# Patient Record
Sex: Female | Born: 1970 | Race: White | Hispanic: No | Marital: Married | Smoking: Former smoker
Health system: Southern US, Community
[De-identification: ages and names within clinical notes are randomized; demographics above are authoritative.]

## PROBLEM LIST (undated history)

## (undated) DIAGNOSIS — F419 Anxiety disorder, unspecified: Secondary | ICD-10-CM

## (undated) DIAGNOSIS — M1990 Unspecified osteoarthritis, unspecified site: Secondary | ICD-10-CM

## (undated) DIAGNOSIS — F909 Attention-deficit hyperactivity disorder, unspecified type: Secondary | ICD-10-CM

## (undated) HISTORY — PX: OTHER SURGICAL HISTORY: SHX169

## (undated) HISTORY — DX: Unspecified osteoarthritis, unspecified site: M19.90

## (undated) HISTORY — PX: BREAST BIOPSY: SHX20

## (undated) HISTORY — PX: TUBAL LIGATION: SHX77

## (undated) HISTORY — PX: CHOLECYSTECTOMY: SHX55

---

## 2006-05-17 HISTORY — PX: OTHER SURGICAL HISTORY: SHX169

## 2006-05-17 HISTORY — PX: BREAST BIOPSY: SHX20

## 2006-05-17 HISTORY — PX: BREAST EXCISIONAL BIOPSY: SUR124

## 2012-05-08 ENCOUNTER — Encounter: Payer: Self-pay | Admitting: Physician Assistant

## 2012-05-08 ENCOUNTER — Ambulatory Visit (INDEPENDENT_AMBULATORY_CARE_PROVIDER_SITE_OTHER): Payer: Managed Care, Other (non HMO) | Admitting: Physician Assistant

## 2012-05-08 VITALS — BP 110/60 | HR 67 | Ht 64.0 in | Wt 113.0 lb

## 2012-05-08 DIAGNOSIS — Z79899 Other long term (current) drug therapy: Secondary | ICD-10-CM

## 2012-05-08 DIAGNOSIS — M79609 Pain in unspecified limb: Secondary | ICD-10-CM

## 2012-05-08 DIAGNOSIS — Z7189 Other specified counseling: Secondary | ICD-10-CM

## 2012-05-08 DIAGNOSIS — Z716 Tobacco abuse counseling: Secondary | ICD-10-CM

## 2012-05-08 DIAGNOSIS — M79641 Pain in right hand: Secondary | ICD-10-CM

## 2012-05-08 DIAGNOSIS — M2550 Pain in unspecified joint: Secondary | ICD-10-CM

## 2012-05-08 MED ORDER — BUPROPION HCL ER (SR) 150 MG PO TB12: ORAL_TABLET | ORAL | Status: DC

## 2012-05-08 MED ORDER — MELOXICAM 7.5 MG PO TABS: 7.5000 mg | ORAL_TABLET | Freq: Every day | ORAL | Status: DC

## 2012-05-08 NOTE — Patient Instructions (Addendum)
Glucosamine Chondrotin, Fish Oil.   Start mobic daily for 2 weeks then as needed.   Wellbutrin to stop smoking.   Osteoarthritis Osteoarthritis is the most common form of arthritis. It is redness, soreness, and swelling (inflammation) affecting the cartilage. Cartilage acts as a cushion, covering the ends of bones where they meet to form a joint. CAUSES  Over time, the cartilage begins to wear away. This causes bone to rub on bone. This produces pain and stiffness in the affected joints. Factors that contribute to this problem are:  Excessive body weight.  Age.  Overuse of joints. SYMPTOMS   People with osteoarthritis usually experience joint pain, swelling, or stiffness.  Over time, the joint may lose its normal shape.  Small deposits of bone (osteophytes) may grow on the edges of the joint.  Bits of bone or cartilage can break off and float inside the joint space. This may cause more pain and damage.  Osteoarthritis can lead to depression, anxiety, feelings of helplessness, and limitations on daily activities. The most commonly affected joints are in the:  Ends of the fingers.  Thumbs.  Neck.  Lower back.  Knees.  Hips. DIAGNOSIS  Diagnosis is mostly based on your symptoms and exam. Tests may be helpful, including:  X-rays of the affected joint.  A computerized magnetic scan (MRI).  Blood tests to rule out other types of arthritis.  Joint fluid tests. This involves using a needle to draw fluid from the joint and examining the fluid under a microscope. TREATMENT  Goals of treatment are to control pain, improve joint function, maintain a normal body weight, and maintain a healthy lifestyle. Treatment approaches may include:  A prescribed exercise program with rest and joint relief.  Weight control with nutritional education.  Pain relief techniques such as:  Properly applied heat and cold.  Electric pulses delivered to nerve endings under the skin  (transcutaneous electrical nerve stimulation, TENS).  Massage.  Certain supplements. Ask your caregiver before using any supplements, especially in combination with prescribed drugs.  Medicines to control pain, such as:  Acetaminophen.  Nonsteroidal anti-inflammatory drugs (NSAIDs), such as naproxen.  Narcotic or central-acting agents, such as tramadol. This drug carries a risk of addiction and is generally prescribed for short-term use.  Corticosteroids. These can be given orally or as injection. This is a short-term treatment, not recommended for routine use.  Surgery to reposition the bones and relieve pain (osteotomy) or to remove loose pieces of bone and cartilage. Joint replacement may be needed in advanced states of osteoarthritis. HOME CARE INSTRUCTIONS  Your caregiver can recommend specific types of exercise. These may include:  Strengthening exercises. These are done to strengthen the muscles that support joints affected by arthritis. They can be performed with weights or with exercise bands to add resistance.  Aerobic activities. These are exercises, such as brisk walking or low-impact aerobics, that get your heart pumping. They can help keep your lungs and circulatory system in shape.  Range-of-motion activities. These keep your joints limber.  Balance and agility exercises. These help you maintain daily living skills. Learning about your condition and being actively involved in your care will help improve the course of your osteoarthritis. SEEK MEDICAL CARE IF:   You feel hot or your skin turns red.  You develop a rash in addition to your joint pain.  You have an oral temperature above 102 F (38.9 C). FOR MORE INFORMATION  National Institute of Arthritis and Musculoskeletal and Skin Diseases: www.niams.http://www.myers.net/ National Institute  on Aging: https://walker.com/ [employer]: www.rheumatology.org Document Released: 05/03/2005 Document Revised:  07/26/2011 Document Reviewed: 08/14/2009 [facility] Patient Information 2013 [place], [place].   Smoking Cessation Quitting smoking is important to your health and has many advantages. However, it is not always easy to quit since nicotine is a very addictive drug. Often times, people try 3 times or more before being able to quit. This document explains the best ways for you to prepare to quit smoking. Quitting takes hard work and a lot of effort, but you can do it. ADVANTAGES OF QUITTING SMOKING  You will live longer, feel better, and live better.  Your body will feel the impact of quitting smoking almost immediately.  Within 20 minutes, blood pressure decreases. Your pulse returns to its normal level.  After 8 hours, carbon monoxide levels in the blood return to normal. Your oxygen level increases.  After 24 hours, the chance of having a heart attack starts to decrease. Your breath, hair, and body stop smelling like smoke.  After 48 hours, damaged nerve endings begin to recover. Your sense of taste and smell improve.  After 72 hours, the body is virtually free of nicotine. Your bronchial tubes relax and breathing becomes easier.  After 2 to 12 weeks, lungs can hold more air. Exercise becomes easier and circulation improves.  The risk of having a heart attack, stroke, cancer, or lung disease is greatly reduced.  After 1 year, the risk of coronary heart disease is cut in half.  After 5 years, the risk of stroke falls to the same as a nonsmoker.  After 10 years, the risk of lung cancer is cut in half and the risk of other cancers decreases significantly.  After 15 years, the risk of coronary heart disease drops, usually to the level of a nonsmoker.  If you are pregnant, quitting smoking will improve your chances of having a healthy baby.  The people you live with, especially any children, will be healthier.  You will have extra money to spend on things other than  cigarettes. QUESTIONS TO THINK ABOUT BEFORE ATTEMPTING TO QUIT You may want to talk about your answers with your caregiver.  Why do you want to quit?  If you tried to quit in the past, what helped and what did not?  What will be the most difficult situations for you after you quit? How will you plan to handle them?  Who can help you through the tough times? Your family? Friends? A caregiver?  What pleasures do you get from smoking? What ways can you still get pleasure if you quit? Here are some questions to ask your caregiver:  How can you help me to be successful at quitting?  What medicine do you think would be best for me and how should I take it?  What should I do if I need more help?  What is smoking withdrawal like? How can I get information on withdrawal? GET READY  Set a quit date.  Change your environment by getting rid of all cigarettes, ashtrays, matches, and lighters in your home, car, or work. Do not let people smoke in your home.  Review your past attempts to quit. Think about what worked and what did not. GET SUPPORT AND ENCOURAGEMENT You have a better chance of being successful if you have help. You can get support in many ways.  Tell your family, friends, and co-workers that you are going to quit and need their support. Ask them not to smoke  around you.  Get individual, group, or telephone counseling and support. Programs are available at [employer] and health centers. Call your local health department for information about programs in your area.  Spiritual beliefs and practices may help some smokers quit.  Download a "quit meter" on your computer to keep track of quit statistics, such as how long you have gone without smoking, cigarettes not smoked, and money saved.  Get a self-help book about quitting smoking and staying off of tobacco. LEARN NEW SKILLS AND BEHAVIORS  Distract yourself from urges to smoke. Talk to someone, go for a walk, or occupy  your time with a task.  Change your normal routine. Take a different route to work. Drink tea instead of coffee. Eat breakfast in a different place.  Reduce your stress. Take a hot bath, exercise, or read a book.  Plan something enjoyable to do every day. Reward yourself for not smoking.  Explore interactive web-based programs that specialize in helping you quit. GET MEDICINE AND USE IT CORRECTLY Medicines can help you stop smoking and decrease the urge to smoke. Combining medicine with the above behavioral methods and support can greatly increase your chances of successfully quitting smoking.  Nicotine replacement therapy helps deliver nicotine to your body without the negative effects and risks of smoking. Nicotine replacement therapy includes nicotine gum, lozenges, inhalers, nasal sprays, and skin patches. Some may be available over-the-counter and others require a prescription.  Antidepressant medicine helps people abstain from smoking, but how this works is unknown. This medicine is available by prescription.  Nicotinic receptor partial agonist medicine simulates the effect of nicotine in your brain. This medicine is available by prescription. Ask your caregiver for advice about which medicines to use and how to use them based on your health history. Your caregiver will tell you what side effects to look out for if you choose to be on a medicine or therapy. Carefully read the information on the package. Do not use any other product containing nicotine while using a nicotine replacement product.  RELAPSE OR DIFFICULT SITUATIONS Most relapses occur within the first 3 months after quitting. Do not be discouraged if you start smoking again. Remember, most people try several times before finally quitting. You may have symptoms of withdrawal because your body is used to nicotine. You may crave cigarettes, be irritable, feel very hungry, cough often, get headaches, or have difficulty concentrating.  The withdrawal symptoms are only temporary. They are strongest when you first quit, but they will go away within 10 14 days. To reduce the chances of relapse, try to:  Avoid drinking alcohol. Drinking lowers your chances of successfully quitting.  Reduce the amount of caffeine you consume. Once you quit smoking, the amount of caffeine in your body increases and can give you symptoms, such as a rapid heartbeat, sweating, and anxiety.  Avoid smokers because they can make you want to smoke.  Do not let weight gain distract you. Many smokers will gain weight when they quit, usually less than 10 pounds. Eat a healthy diet and stay active. You can always lose the weight gained after you quit.  Find ways to improve your mood other than smoking. FOR MORE INFORMATION  www.smokefree.gov  Document Released: 04/27/2001 Document Revised: 11/02/2011 Document Reviewed: 08/12/2011 [facility] Patient Information 2013 [place], [place].

## 2012-05-08 NOTE — Progress Notes (Signed)
Subjective:    Patient ID: Debra Massey, female    DOB: 09/08/1970, 41 y.o.   MRN: [id number]  HPI Patient is a 41 year old female who presents to the clinic to establish care and to discuss ongoing right hand finger stiffness and pain. Past medical history is negative for any chronic or ongoing condition. Patient does take Aleve as needed for hand pain as well as other aches and pains.  Patient has had ongoing bilateral hand pain and stiffness for the last 3 years that has been manageable with a week. She has noticed in the last 3 weeks that the pain has gotten worse mostly in the right hand. She describes the pain as burning and shooting throughout her hand. Aleve does not touch this pain. 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 The pain gets worse throughout the day and with movement. About 4 years ago she did have nerve conduction studies done on both hands and were normal. She denies any numbness or tingling of the fingers or hand. She does feel like sometimes the index finger almost gets stuck. She denies any joint swelling.  Patient reports that her Pap and regular blood work was done last year about this time. She reports everything was normal. 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 Patient has yearly mammograms do to the family history of mother having breast cancer.  Surgical history positive for cholecystectomy and laparoscopically.  Patient is a current smoker of a pack a day for the last 27 years. She is interested in quitting. She did try Wellbutrin at one point but insurance would not pay for it for smoking cessation. She was on it for a short while and it did help control her urges.     Review of Systems     Objective:   Physical Exam  Constitutional: She is oriented to person, place, and time. She appears well-developed and well-nourished.  HENT:  Head: Normocephalic and atraumatic.  Cardiovascular: Normal rate, regular rhythm and normal  heart sounds.   Pulmonary/Chest: Effort normal and breath sounds normal. She has no wheezes.  Musculoskeletal:       Bilateral range of motion of both hands is full and without pain. Strength of both hands is 5 out of 5. There is no bony tenderness with palpation with either hand. No joint swelling noted bilaterally. They did appear to be enlarged MCP joints. Patient's fingers were able to be extended and flexed without clicking or cracking.  Neurological: She is alert and oriented to person, place, and time.  Skin: Skin is warm and dry.  Psychiatric: She has a normal mood and affect. Her behavior is normal.          Assessment & Plan:  Right hand and joint pain-I suspect patient is suffering from osteoarthritis do to long history of overuse in her field of work as a Interior and spatial designer. I discussed with patient other possibilities such as RA. I do not think at this time we need to do imaging or blood testing for RA. I would like to try Mobic 7.5 mg daily and see how she responds. Encouraged patient to take daily for 2 weeks and then use only as needed. Discussed with patient not to take any other anti-inflammatories while on Mobic. Will check kidney today to make sure there is no abnormality since putting on an anti-inflammatory. Gave patient handout on osteoarthritis and other symptomatic treatment she could consider. Mentioned over-the-counter treatment such as  glucosamine chondrotic and Fish oil. Patient was instructed to follow up in the clinic in 4-6 weeks to assess status. She is always welcome to call if pain worsens or new symptoms arise.  Smoking cessation-patient reports she is ready to quit smoking and we would like to try Wellbutrin again. I did give her a three-month supply. Instructions were to start 150 mg once a day for 3 days then increase to twice a day for 6-12 weeks. Instructed patient that the goal is to stop smoking within the first 7 days of treatment with Wellbutrin. We talked a lot  about the oral fixation of smoking and ways to combat that with healthy options. Gave patient a handout on smoking cessation and the encouraging timeline of what happens with the body after stopping smoking. Patient does have T6 and encourage usage of those that of cigarettes. Did report patient could use Nicorette along with Wellbutrin.Would like to see in 3 months to talk about progress.  30 minutes was spent with patient and greater than 50% of the visit was spent in counseling about smoking cessation.

## 2012-05-09 LAB — COMPREHENSIVE METABOLIC PANEL
ALT: 18 U/L (ref 0–35)
Alkaline Phosphatase: 37 U/L — ABNORMAL LOW (ref 39–117)
Creat: 0.77 mg/dL (ref 0.50–1.10)
Sodium: 137 mEq/L (ref 135–145)
Total Bilirubin: 0.5 mg/dL (ref 0.3–1.2)
Total Protein: 7.1 g/dL (ref 6.0–8.3)

## 2012-06-16 ENCOUNTER — Telehealth: Payer: Self-pay | Admitting: *Deleted

## 2012-06-16 NOTE — Telephone Encounter (Signed)
Pt can't tell yet if the wellbutrin is helping with the craving because she had been off of it for a few weeks & just started back this past Monday.  She doesn't really want to go the chantix route so she is going to try to eat in the mornings & stay on the wellbutrin.

## 2012-06-16 NOTE — Telephone Encounter (Signed)
Chantix twice a day medication also. I do think it helps more if quitting smoking if that is your goal. It does come with risk of side effect of weird thoughts and suicidal thinking. If have these can stop. Most meds are requested you eat with so may run into that problem. Can try taking without eating or eat a couple of bites of something. Is wellbutrin helping curb you craving?

## 2012-06-16 NOTE — Telephone Encounter (Signed)
Pt calls & states that she's having a hard time taking the wellbutrin twice a day because she doesn't eat in the mornings.  She is asking if chantix would work just as well as wellbutrin & wants to know if it's taken just once a day or if taking the wellbutrin just once a day will be effective enough.  Please advise

## 2012-09-22 ENCOUNTER — Other Ambulatory Visit: Payer: Self-pay | Admitting: *Deleted

## 2012-09-22 MED ORDER — MELOXICAM 7.5 MG PO TABS: 7.5000 mg | ORAL_TABLET | Freq: Every day | ORAL | Status: DC

## 2012-09-22 NOTE — Telephone Encounter (Signed)
meloxicam refilled.

## 2012-09-26 ENCOUNTER — Telehealth: Payer: Self-pay | Admitting: *Deleted

## 2012-09-26 NOTE — Telephone Encounter (Signed)
Pt calls & states that her thumb on her right hand is killing her.  She recently started taking the mobic 7.5mg  again but feels like it isn't helping.  She asked if she could double up on the meds.  I advised against it & told her I would speak with you first.  She is a pt of [facility]. She is using [facility] [address].  Please advise

## 2012-09-26 NOTE — Telephone Encounter (Signed)
Pt notified.  She is actually on your schedule for Monday due to [facility] being [place] the [place] & her being in so much pain.

## 2012-09-26 NOTE — Telephone Encounter (Signed)
Double Mobic to 15 mg daily. She should also follow up with Tandy Gaw, PA-C regarding increased pain in a week to determine if a referral to me may be necessary.

## 2012-10-02 ENCOUNTER — Ambulatory Visit (INDEPENDENT_AMBULATORY_CARE_PROVIDER_SITE_OTHER): Payer: Managed Care, Other (non HMO)

## 2012-10-02 ENCOUNTER — Ambulatory Visit (INDEPENDENT_AMBULATORY_CARE_PROVIDER_SITE_OTHER): Payer: Managed Care, Other (non HMO) | Admitting: Sports Medicine

## 2012-10-02 ENCOUNTER — Encounter: Payer: Self-pay | Admitting: Sports Medicine

## 2012-10-02 VITALS — BP 137/85 | HR 58 | Wt 121.0 lb

## 2012-10-02 DIAGNOSIS — M25539 Pain in unspecified wrist: Secondary | ICD-10-CM

## 2012-10-02 DIAGNOSIS — M79641 Pain in right hand: Secondary | ICD-10-CM

## 2012-10-02 DIAGNOSIS — M25549 Pain in joints of unspecified hand: Secondary | ICD-10-CM

## 2012-10-02 DIAGNOSIS — M19041 Primary osteoarthritis, right hand: Secondary | ICD-10-CM | POA: Insufficient documentation

## 2012-10-02 DIAGNOSIS — M79609 Pain in unspecified limb: Secondary | ICD-10-CM

## 2012-10-02 DIAGNOSIS — M19042 Primary osteoarthritis, left hand: Secondary | ICD-10-CM | POA: Insufficient documentation

## 2012-10-02 MED ORDER — CELECOXIB 200 MG PO CAPS: ORAL_CAPSULE | ORAL | Status: DC

## 2012-10-02 NOTE — Progress Notes (Signed)
Subjective:    I'm seeing this patient as a consultation for:  Tandy Gaw, PA-C  CC: Hand pain  HPI: This is a very pleasant 42 year old female who comes in with a several month history of pain on and off but she localizes in her right second metacarpophalangeal joint. She does hair for a living, and notes it's difficult on days when she is very busy. She describes the pain as sharp, and notes swelling in the joint. She has no pain coming from her neck, and no numbness or tingling in the hand. She has been tested for carpal tunnel syndrome in the past and was negative per patient. She's been using Mobic 15 mg daily and pain has remained the same. There is no family history of rheumatologic disease. Pain is localized, moderate, stable.  Past medical history, Surgical history, Family history not pertinant except as noted below, Social history, Allergies, and medications have been entered into the medical record, reviewed, and no changes needed.   Review of Systems: No headache, visual changes, nausea, vomiting, diarrhea, constipation, dizziness, abdominal pain, skin rash, fevers, chills, night sweats, weight loss, swollen lymph nodes, body aches, joint swelling, muscle aches, chest pain, shortness of breath, mood changes, visual or auditory hallucinations.   Objective:   General: Well Developed, well nourished, and in no acute distress.  Neuro/Psych: Alert and oriented x3, extra-ocular muscles intact, able to move all 4 extremities, sensation grossly intact. Skin: Warm and dry, no rashes noted.  Respiratory: Not using accessory muscles, speaking in full sentences, trachea midline.  Cardiovascular: Pulses palpable, no extremity edema. Abdomen: Does not appear distended. Neck: Inspection unremarkable. No palpable stepoffs. Negative Spurling's maneuver. Full neck range of motion Grip strength and sensation normal in bilateral hands Strength good C4 to T1 distribution No sensory change to  C4 to T1 Negative Hoffman sign bilaterally Reflexes normal Right Wrist: Inspection normal with no visible erythema or swelling. ROM smooth and normal with good flexion and extension and ulnar/radial deviation that is symmetrical with opposite wrist. Palpation is normal over metacarpals, navicular, lunate, and TFCC; tendons without tenderness/ swelling No snuffbox tenderness. No tenderness over Canal of Guyon. Strength 5/5 in all directions without pain. Negative Finkelstein, tinel's and phalens. Negative Watson's test. Hands: Right hand shows fusiform swelling over the right second metacarpophalangeal joint compared to the left. There is tenderness to palpation over the lateral joint line. Range of motion, strength, collateral ligaments are all unremarkable. She is neurovascularly intact distally.  X-rays of the hands and wrist are reviewed, there is mild degenerative changes on the second metacarpophalangeal joint, there is also mild DJD with some subchondral sclerosis in the radiocarpal joint.  Procedure: Real-time Ultrasound Guided Injection of right second metacarpophalangeal joint Device: GE Logiq E  Ultrasound guided injection is preferred based studies that show increased duration, increased effect, greater accuracy, decreased procedural pain, increased response rate, and decreased cost with ultrasound guided versus blind injection.  Verbal informed consent obtained.  Time-out conducted.  Noted no overlying erythema, induration, or other signs of local infection.  Skin prepped in a sterile fashion.  Local anesthesia: Topical Ethyl chloride.  With sterile technique and under real time ultrasound guidance:  0.5 cc Kenalog 40, 0.5 cc lidocaine injected easily into the joint.. Completed without difficulty  Pain immediately resolved suggesting accurate placement of the medication.  Advised to call if fevers/chills, erythema, induration, drainage, or persistent bleeding.  Images  permanently stored and available for review in the ultrasound unit.  Impression: Technically  successful ultrasound guided injection.  Impression and Recommendations:   This case required medical decision making of moderate complexity.

## 2012-10-02 NOTE — Assessment & Plan Note (Addendum)
Injection of the right second metacarpal phalangeal joint. X-rays of the wrist and hand. I've asked her to wear her brace, and ice the joint. Return in 4 weeks. Celebrex samples given today. Stop Mobic for now.  Clinically this resembles osteoarthritis, the anatomic distribution, as well as age of onset is unlikely to represent a rheumatoid condition.

## 2012-10-05 ENCOUNTER — Other Ambulatory Visit: Payer: Self-pay | Admitting: Sports Medicine

## 2012-10-05 DIAGNOSIS — M79641 Pain in right hand: Secondary | ICD-10-CM

## 2012-10-05 MED ORDER — CELECOXIB 200 MG PO CAPS: ORAL_CAPSULE | ORAL | Status: DC

## 2012-10-06 ENCOUNTER — Telehealth: Payer: Self-pay | Admitting: *Deleted

## 2012-10-06 MED ORDER — DICLOFENAC SODIUM 75 MG PO TBEC: 75.0000 mg | DELAYED_RELEASE_TABLET | Freq: Two times a day (BID) | ORAL | Status: DC

## 2012-10-06 NOTE — Telephone Encounter (Signed)
Pt calls and states that the Celebrex you put her on is too expensive and wants to know if there is an alternative you can send in for her

## 2012-10-06 NOTE — Telephone Encounter (Signed)
Can try diclofenac

## 2012-10-06 NOTE — Telephone Encounter (Signed)
Pt notified different med sent to pharmacy. Barry Dienes, LPN

## 2012-10-30 ENCOUNTER — Ambulatory Visit: Payer: Managed Care, Other (non HMO) | Admitting: Sports Medicine

## 2012-11-01 ENCOUNTER — Ambulatory Visit (INDEPENDENT_AMBULATORY_CARE_PROVIDER_SITE_OTHER): Payer: Managed Care, Other (non HMO)

## 2012-11-01 ENCOUNTER — Encounter: Payer: Self-pay | Admitting: Sports Medicine

## 2012-11-01 ENCOUNTER — Ambulatory Visit (INDEPENDENT_AMBULATORY_CARE_PROVIDER_SITE_OTHER): Payer: Managed Care, Other (non HMO) | Admitting: Sports Medicine

## 2012-11-01 VITALS — BP 121/78 | HR 59 | Wt 118.0 lb

## 2012-11-01 DIAGNOSIS — M79642 Pain in left hand: Secondary | ICD-10-CM

## 2012-11-01 DIAGNOSIS — M545 Low back pain, unspecified: Secondary | ICD-10-CM

## 2012-11-01 DIAGNOSIS — M79609 Pain in unspecified limb: Secondary | ICD-10-CM

## 2012-11-01 DIAGNOSIS — M5136 Other intervertebral disc degeneration, lumbar region: Secondary | ICD-10-CM | POA: Insufficient documentation

## 2012-11-01 DIAGNOSIS — M51369 Other intervertebral disc degeneration, lumbar region without mention of lumbar back pain or lower extremity pain: Secondary | ICD-10-CM | POA: Insufficient documentation

## 2012-11-01 NOTE — Progress Notes (Signed)
  Subjective:    CC: Followup  HPI: Hand osteoarthritis:  Right second metacarpophalangeal joint injected at the last visit and is pain-free. Now she has pain which he localizes at the left first interphalangeal joint. Good response so far to oral analgesics.  Low back pain: Present for years, has been to multiple providers who've told her different things, she localizes her pain doubt of the coccyx, denies any history of trauma. Pain is not worse with any position it typically comes randomly. She did have an MRI but is unsure where it was done or what the results showed. There is no radicular pain, it is mild to moderate, and intermittent.  Past medical history, Surgical history, Family history not pertinant except as noted below, Social history, Allergies, and medications have been entered into the medical record, reviewed, and no changes needed.   Review of Systems: No fevers, chills, night sweats, weight loss, chest pain, or shortness of breath.   Objective:    General: Well Developed, well nourished, and in no acute distress.  Neuro: Alert and oriented x3, extra-ocular muscles intact, sensation grossly intact.  HEENT: Normocephalic, atraumatic, pupils equal round reactive to light, neck supple, no masses, no lymphadenopathy, thyroid nonpalpable.  Skin: Warm and dry, no rashes. Cardiac: Regular rate and rhythm, no murmurs rubs or gallops, no lower extremity edema.  Respiratory: Clear to auscultation bilaterally. Not using accessory muscles, speaking in full sentences. Back Exam:  Inspection: Unremarkable  Motion: Flexion 45 deg, Extension 45 deg, Side Bending to 45 deg bilaterally,  Rotation to 45 deg bilaterally  SLR laying: Negative  XSLR laying: Negative  Palpable tenderness: I am unable to reproduce her pain even with direct palpation over the coccyx. FABER: negative. Sensory change: Gross sensation intact to all lumbar and sacral dermatomes.  Reflexes: 2+ at both patellar  tendons, 2+ at achilles tendons, Babinski's downgoing.  Strength at foot  Plantar-flexion: 5/5 Dorsi-flexion: 5/5 Eversion: 5/5 Inversion: 5/5  Leg strength  Quad: 5/5 Hamstring: 5/5 Hip flexor: 5/5 Hip abductors: 5/5  Gait unremarkable.  X-rays were reviewed and are overall unremarkable with the exception of a sacrococcygeal disc that may serve as a target for injection in the future.  Impression and Recommendations:

## 2012-11-01 NOTE — Assessment & Plan Note (Signed)
This is chronic and present for years. I do not get much disc or SI symptoms. I do suspect coccydynia, we will x-ray her sacrum in her low back. I like her to do some SI joint exercises. Continue diclofenac. She will work on getting the disc from her back MRI so that we can review it. Since finances are an issue here, we can certainly try most of her interventions here in the office, I would likely start with inter-coccygeal blocks before proceeding to sacroiliac joint blocks to try and find her pain generating structure. She will see me back in 2 weeks.

## 2012-11-01 NOTE — Assessment & Plan Note (Signed)
Right second metacarpophalangeal joint pain is improved significantly. I am happy to inject her left first interphalangeal joint whenever she desires.

## 2012-11-02 ENCOUNTER — Encounter: Payer: Self-pay | Admitting: Sports Medicine

## 2012-11-20 ENCOUNTER — Ambulatory Visit: Payer: Managed Care, Other (non HMO) | Admitting: Sports Medicine

## 2012-12-29 ENCOUNTER — Encounter: Payer: Self-pay | Admitting: Physician Assistant

## 2012-12-29 ENCOUNTER — Ambulatory Visit (INDEPENDENT_AMBULATORY_CARE_PROVIDER_SITE_OTHER): Payer: Managed Care, Other (non HMO) | Admitting: Physician Assistant

## 2012-12-29 VITALS — BP 132/80 | HR 82 | Wt 117.0 lb

## 2012-12-29 DIAGNOSIS — J3489 Other specified disorders of nose and nasal sinuses: Secondary | ICD-10-CM

## 2012-12-29 DIAGNOSIS — R519 Headache, unspecified: Secondary | ICD-10-CM

## 2012-12-29 DIAGNOSIS — R51 Headache: Secondary | ICD-10-CM

## 2012-12-29 DIAGNOSIS — K089 Disorder of teeth and supporting structures, unspecified: Secondary | ICD-10-CM

## 2012-12-29 DIAGNOSIS — K0889 Other specified disorders of teeth and supporting structures: Secondary | ICD-10-CM

## 2012-12-29 MED ORDER — PREDNISONE 50 MG PO TABS: ORAL_TABLET | ORAL | Status: DC

## 2012-12-29 MED ORDER — HYDROCODONE-ACETAMINOPHEN 5-325 MG PO TABS: 1.0000 | ORAL_TABLET | Freq: Four times a day (QID) | ORAL | Status: DC | PRN

## 2012-12-29 MED ORDER — AMOXICILLIN-POT CLAVULANATE 875-125 MG PO TABS: 1.0000 | ORAL_TABLET | Freq: Two times a day (BID) | ORAL | Status: DC

## 2012-12-29 NOTE — Patient Instructions (Signed)
Take prednisone for 5 days

## 2012-12-29 NOTE — Progress Notes (Signed)
  Subjective:    Patient ID: Debra Massey, female    DOB: 07/03/1970, 42 y.o.   MRN: [id number]  HPI Patient presents to the clinic with facial pain for 4 days. Pt had 2 fillings done on her front teeth on Monday. Tuesday her teeth felt very swollen and tender. It continues to get more painful. She called dentist on Wednesday and has not heard back. Pain and pressure has progressed into her left sinus and is tender to touch. Denies any fever, chills, ear pain, congestion, runny nose, cough, SOB. She has tried ibuprofen but has not helped.     Review of Systems     Objective:   Physical Exam  Constitutional: She is oriented to person, place, and time. She appears well-developed and well-nourished.  HENT:  Head: Normocephalic and atraumatic.  Right Ear: External ear normal.  Left Ear: External ear normal.  Nose: Nose normal.  Mouth/Throat: Oropharynx is clear and moist.  Tenderness to palpation under nares and into left maxillary sinus.  No abscess formation at front top teeth.    Eyes: Conjunctivae are normal. Right eye exhibits no discharge. Left eye exhibits no discharge.  Neck: Normal range of motion. Neck supple.  Cardiovascular: Normal rate, regular rhythm and normal heart sounds.   Pulmonary/Chest: Effort normal and breath sounds normal.  Lymphadenopathy:    She has no cervical adenopathy.  Neurological: She is alert and oriented to person, place, and time.  Skin: Skin is warm and dry.  Psychiatric: She has a normal mood and affect. Her behavior is normal.          Assessment & Plan:  Facial pain/teeth pain- I do not think sinusitis;however, since did have dental work done will treat with augmentin for 10 days if start of infection is occuring. Likely dental work has created inflammation of nerves and will treat with prednisone for 5 days. Can continue to take Ibuprofen at home and gave vicodin for break through pain only as needed. Follow up with dentist.

## 2013-03-05 ENCOUNTER — Encounter: Payer: Self-pay | Admitting: Sports Medicine

## 2013-03-05 ENCOUNTER — Ambulatory Visit (INDEPENDENT_AMBULATORY_CARE_PROVIDER_SITE_OTHER): Payer: Managed Care, Other (non HMO) | Admitting: Sports Medicine

## 2013-03-05 VITALS — BP 125/80 | HR 81 | Wt 118.0 lb

## 2013-03-05 DIAGNOSIS — Q667 Congenital pes cavus, unspecified foot: Secondary | ICD-10-CM | POA: Insufficient documentation

## 2013-03-05 MED ORDER — DICLOFENAC SODIUM 75 MG PO TBEC: 75.0000 mg | DELAYED_RELEASE_TABLET | Freq: Two times a day (BID) | ORAL | Status: DC

## 2013-03-05 NOTE — Progress Notes (Signed)
    Patient was fitted for a : standard, cushioned, semi-rigid orthotic. The orthotic was heated and afterward the patient stood on the orthotic blank positioned on the orthotic stand. The patient was positioned in subtalar neutral position and 10 degrees of ankle dorsiflexion in a weight bearing stance. After completion of molding, a stable base was applied to the orthotic blank. The blank was ground to a stable position for weight bearing. Size: 6 Base: Blue EVA Additional Posting and Padding: Medium metatarsal pads placed bilaterally. The patient ambulated these, and they were very comfortable.  I spent 40 minutes with this patient, greater than 50% was face-to-face time counseling regarding the below diagnosis.

## 2013-03-05 NOTE — Assessment & Plan Note (Signed)
Orthotics as above. There is breakdown of the short arch drop of the metatarsal heads and abnormal callous under the second through fourth metatarsal heads. There is also clawing of the toes. Orthotics were treated with bilateral metatarsal pads. Return in 4 weeks as needed.

## 2013-05-15 ENCOUNTER — Telehealth: Payer: Self-pay | Admitting: *Deleted

## 2013-05-15 MED ORDER — CELECOXIB 200 MG PO CAPS: ORAL_CAPSULE | ORAL | Status: DC

## 2013-05-15 NOTE — Telephone Encounter (Signed)
Thank you for the [facility] notes on [place], I am sending in #30 celebrex now!

## 2013-05-15 NOTE — Telephone Encounter (Signed)
Pt wants to know if you will send her a rx for the generic celebrex to the [facility] on [address].  Thanks!

## 2013-05-15 NOTE — Telephone Encounter (Signed)
Please send only #30 of the generic celebrex to the [facility] on [address]

## 2013-05-15 NOTE — Telephone Encounter (Signed)
Pt notified. ([facility])

## 2013-12-10 ENCOUNTER — Encounter: Payer: Self-pay | Admitting: Sports Medicine

## 2013-12-10 ENCOUNTER — Ambulatory Visit (INDEPENDENT_AMBULATORY_CARE_PROVIDER_SITE_OTHER): Payer: BC Managed Care – PPO | Admitting: Sports Medicine

## 2013-12-10 VITALS — BP 129/82 | HR 57 | Ht 63.0 in | Wt 108.0 lb

## 2013-12-10 DIAGNOSIS — M5441 Lumbago with sciatica, right side: Secondary | ICD-10-CM

## 2013-12-10 DIAGNOSIS — M5430 Sciatica, unspecified side: Secondary | ICD-10-CM | POA: Diagnosis not present

## 2013-12-10 MED ORDER — PREDNISONE 50 MG PO TABS: ORAL_TABLET | ORAL | Status: DC

## 2013-12-10 MED ORDER — AMITRIPTYLINE HCL 50 MG PO TABS: ORAL_TABLET | ORAL | Status: DC

## 2013-12-10 NOTE — Progress Notes (Signed)
   Subjective:    I'm seeing this patient as a consultation for:  Debra GawJade Breeback, PA-C  CC: Foot tingling and numbness  HPI: Debra Massey is a pleasant 43 year old female, for the past several months she's had numbness and tingling that she localizes in both feet but worse on the right side, radiating over her right anterior shin, and into the bottom of her right foot. It's worse at night, but she also experiences numbness and tingling when seated for long periods of time, driving a car, and with flexion. She has an MRI that is 766 or 43 years old I reviewed in the distant past, she had been told multiple different stories by her old primary care provider at a different practice, including that she had a possible fractured coccyx. I interpreted her MRI sometime ago, and saw a fairly large L4-L5 broad-based disc protrusion. Symptoms are moderate, persistent.  Past medical history, Surgical history, Family history not pertinant except as noted below, Social history, Allergies, and medications have been entered into the medical record, reviewed, and no changes needed.   Review of Systems: No headache, visual changes, nausea, vomiting, diarrhea, constipation, dizziness, abdominal pain, skin rash, fevers, chills, night sweats, weight loss, swollen lymph nodes, body aches, joint swelling, muscle aches, chest pain, shortness of breath, mood changes, visual or auditory hallucinations.   Objective:   General: Well Developed, well nourished, and in no acute distress.  Neuro/Psych: Alert and oriented x3, extra-ocular muscles intact, able to move all 4 extremities, sensation grossly intact. Skin: Warm and dry, no rashes noted.  Respiratory: Not using accessory muscles, speaking in full sentences, trachea midline.  Cardiovascular: Pulses palpable, no extremity edema. Abdomen: Does not appear distended. Back Exam:  Inspection: Unremarkable  Motion: Flexion 45 deg, Extension 45 deg, Side Bending to 45 deg bilaterally,   Rotation to 45 deg bilaterally  SLR laying: Negative  XSLR laying: Negative  Palpable tenderness: None. FABER: negative for any reproduction of back pain, interestingly this test reproduces paresthesias in the right foot.. Sensory change: Gross sensation intact to all lumbar and sacral dermatomes.  Reflexes: 2+ at both patellar tendons, 2+ at the left Achilles tendon, 1+ at right Achilles tendon, Babinski's downgoing.  Strength at foot  Plantar-flexion: 5/5 Dorsi-flexion: 5/5 Eversion: 5/5 Inversion: 5/5  Leg strength  Quad: 5/5 Hamstring: 5/5 Hip flexor: 5/5 Hip abductors: 5/5  Gait unremarkable.  Old MRI shows a broad-based L4-L5 disc protrusion.  Impression and Recommendations:   This case required medical decision making of moderate complexity.

## 2013-12-10 NOTE — Assessment & Plan Note (Addendum)
[name] is now having right-sided numbness and tingling in the L5 distribution on the right side, but also on the left. Initially I was going to do a metabolic workup in the form of chemistries, CBC, TSH, B12, A1c, and urine and serum protein electrophoresis, however she did have an MRI in the distant past with the suggested etiology of her symptoms. Prednisone, Elavil at bedtime. Home rehabilitation exercises. MRI from the distant past to show an L4-L5 broad-based disc protrusion. Her anatomy has likely changed significantly, we will try to treat this conservatively but if we fail we will need another MRI.  Return in one month, epidural if no better.

## 2014-01-07 ENCOUNTER — Encounter: Payer: Self-pay | Admitting: Sports Medicine

## 2014-01-07 ENCOUNTER — Ambulatory Visit (INDEPENDENT_AMBULATORY_CARE_PROVIDER_SITE_OTHER): Payer: BC Managed Care – PPO | Admitting: Sports Medicine

## 2014-01-07 VITALS — BP 110/71 | HR 64 | Ht 63.0 in | Wt 109.0 lb

## 2014-01-07 DIAGNOSIS — M5430 Sciatica, unspecified side: Secondary | ICD-10-CM | POA: Diagnosis not present

## 2014-01-07 DIAGNOSIS — B353 Tinea pedis: Secondary | ICD-10-CM | POA: Insufficient documentation

## 2014-01-07 DIAGNOSIS — M5441 Lumbago with sciatica, right side: Secondary | ICD-10-CM

## 2014-01-07 LAB — CBC
HCT: 43.3 % (ref 36.0–46.0)
Hemoglobin: 14.6 g/dL (ref 12.0–15.0)
MCH: 29.4 pg (ref 26.0–34.0)
MCHC: 33.7 g/dL (ref 30.0–36.0)
MCV: 87.3 fL (ref 78.0–100.0)
Platelets: 220 10*3/uL (ref 150–400)
RBC: 4.96 MIL/uL (ref 3.87–5.11)
RDW: 13.5 % (ref 11.5–15.5)
WBC: 5 K/uL (ref 4.0–10.5)

## 2014-01-07 LAB — PROTEIN ELECTROPHORESIS, URINE REFLEX

## 2014-01-07 LAB — VITAMIN B12: Vitamin B-12: 564 pg/mL (ref 211–911)

## 2014-01-07 LAB — COMPREHENSIVE METABOLIC PANEL WITH GFR
ALT: 12 U/L (ref 0–35)
AST: 17 U/L (ref 0–37)
Albumin: 4.7 g/dL (ref 3.5–5.2)
Chloride: 106 meq/L (ref 96–112)
Creat: 0.81 mg/dL (ref 0.50–1.10)
Potassium: 5.1 meq/L (ref 3.5–5.3)
Sodium: 139 meq/L (ref 135–145)
Total Protein: 6.8 g/dL (ref 6.0–8.3)

## 2014-01-07 LAB — COMPREHENSIVE METABOLIC PANEL
Alkaline Phosphatase: 41 U/L (ref 39–117)
BUN: 10 mg/dL (ref 6–23)
CO2: 28 mEq/L (ref 19–32)
Calcium: 9.6 mg/dL (ref 8.4–10.5)
Glucose, Bld: 96 mg/dL (ref 70–99)
Total Bilirubin: 0.4 mg/dL (ref 0.2–1.2)

## 2014-01-07 LAB — TSH: TSH: 2.218 u[IU]/mL (ref 0.350–4.500)

## 2014-01-07 LAB — HEMOGLOBIN A1C
Hgb A1c MFr Bld: 5.9 % — ABNORMAL HIGH (ref <5.7)
Mean Plasma Glucose: 123 mg/dL — ABNORMAL HIGH (ref <117)

## 2014-01-07 MED ORDER — TERBINAFINE HCL 250 MG PO TABS: 250.0000 mg | ORAL_TABLET | Freq: Every day | ORAL | Status: AC

## 2014-01-07 NOTE — Progress Notes (Signed)
  Subjective:    CC: Followup  HPI: Left-sided radiculitis: L4 versus L5 distribution, improved significantly with prednisone and intermittent use amitriptyline, has been noncompliant with rehabilitation exercises, switched her job as well and feels significantly better overall.  Rash on feet:  Left-sided, peeling of skin, itching, has tried topical antifungal without any improvement. Symptoms are moderate, persistent.  Past medical history, Surgical history, Family history not pertinant except as noted below, Social history, Allergies, and medications have been entered into the medical record, reviewed, and no changes needed.   Review of Systems: No fevers, chills, night sweats, weight loss, chest pain, or shortness of breath.   Objective:    General: Well Developed, well nourished, and in no acute distress.  Neuro: Alert and oriented x3, extra-ocular muscles intact, sensation grossly intact.  HEENT: Normocephalic, atraumatic, pupils equal round reactive to light, neck supple, no masses, no lymphadenopathy, thyroid nonpalpable.  Skin: Warm and dry, there is peeling of the skin between the toes on the left foot, as well as scaling. This is consistent with tinea pedis. Cardiac: Regular rate and rhythm, no murmurs rubs or gallops, no lower extremity edema.  Respiratory: Clear to auscultation bilaterally. Not using accessory muscles, speaking in full sentences.  Impression and Recommendations:

## 2014-01-07 NOTE — Assessment & Plan Note (Signed)
One month of Lamisil.

## 2014-01-07 NOTE — Assessment & Plan Note (Addendum)
Symptoms have improved with amitriptyline. Continues to have left-sided L4 versus L5 radicular symptoms better much less often. Increase compliance with amitriptyline and go up to one half tab twice a day, also increase compliance with home rehabilitation exercises. I am going to do a medical workup as well.

## 2014-01-09 LAB — PROTEIN ELECTROPHORESIS, SERUM
Albumin ELP: 66.4 % — ABNORMAL HIGH (ref 55.8–66.1)
Alpha-1-Globulin: 3.9 % (ref 2.9–4.9)
Alpha-2-Globulin: 8.6 % (ref 7.1–11.8)
Beta 2: 3.6 % (ref 3.2–6.5)
Beta Globulin: 7.1 % (ref 4.7–7.2)
Gamma Globulin: 10.4 % — ABNORMAL LOW (ref 11.1–18.8)
Total Protein, Serum Electrophoresis: 6.8 g/dL (ref 6.0–8.3)

## 2014-01-15 ENCOUNTER — Other Ambulatory Visit: Payer: Self-pay | Admitting: Sports Medicine

## 2014-01-17 ENCOUNTER — Telehealth: Payer: Self-pay | Admitting: Sports Medicine

## 2014-01-17 DIAGNOSIS — M5441 Lumbago with sciatica, right side: Secondary | ICD-10-CM

## 2014-01-17 LAB — PROTEIN ELECTROPHORESIS, URINE REFLEX
Total Protein, Urine/Day: 60 mg/d (ref 50–100)
Total Protein, Urine: 4 mg/dL

## 2014-01-17 MED ORDER — TRAMADOL HCL 50 MG PO TABS: ORAL_TABLET | ORAL | Status: DC

## 2014-01-17 NOTE — Telephone Encounter (Signed)
Patient with increasing pain, radicular symptoms, currently doing amitriptyline one half tablet twice a day. Recommended increasing to one full-time twice a day and adding tramadol for breakthrough pain.

## 2014-02-04 ENCOUNTER — Ambulatory Visit: Payer: BC Managed Care – PPO | Admitting: Sports Medicine

## 2014-02-11 ENCOUNTER — Ambulatory Visit: Payer: BC Managed Care – PPO | Admitting: Sports Medicine

## 2014-03-14 ENCOUNTER — Ambulatory Visit: Payer: BC Managed Care – PPO | Admitting: Sports Medicine

## 2014-03-15 ENCOUNTER — Ambulatory Visit (INDEPENDENT_AMBULATORY_CARE_PROVIDER_SITE_OTHER): Payer: BC Managed Care – PPO | Admitting: Sports Medicine

## 2014-03-15 ENCOUNTER — Encounter: Payer: Self-pay | Admitting: Sports Medicine

## 2014-03-15 VITALS — BP 133/77 | HR 64 | Ht 63.0 in | Wt 115.0 lb

## 2014-03-15 DIAGNOSIS — M5441 Lumbago with sciatica, right side: Secondary | ICD-10-CM

## 2014-03-15 DIAGNOSIS — M79641 Pain in right hand: Secondary | ICD-10-CM | POA: Diagnosis not present

## 2014-03-15 NOTE — Assessment & Plan Note (Signed)
Right second metacarpal phalangeal joint continues to be pain-free approximately 18 months after the last injection. Left second metacarpal phalangeal joint injection as above. Return as needed.

## 2014-03-15 NOTE — Progress Notes (Signed)
  Subjective:    CC: Left hand pain  HPI: This is a pleasant 43 year old female, 16 months ago we injected her right second metacarpal phalangeal joint, she continues to be pain-free. She has now developed pain that she localizes at the left second metacarpal phalangeal joint, moderate, persistent without radiation. She desires interventional treatment.  Lumbar degenerative disc disease: Continues to do well off of amitriptyline now.  Past medical history, Surgical history, Family history not pertinant except as noted below, Social history, Allergies, and medications have been entered into the medical record, reviewed, and no changes needed.   Review of Systems: No fevers, chills, night sweats, weight loss, chest pain, or shortness of breath.   Objective:    General: Well Developed, well nourished, and in no acute distress.  Neuro: Alert and oriented x3, extra-ocular muscles intact, sensation grossly intact.  HEENT: Normocephalic, atraumatic, pupils equal round reactive to light, neck supple, no masses, no lymphadenopathy, thyroid nonpalpable.  Skin: Warm and dry, no rashes. Cardiac: Regular rate and rhythm, no murmurs rubs or gallops, no lower extremity edema.  Respiratory: Clear to auscultation bilaterally. Not using accessory muscles, speaking in full sentences. Left hand: Tender to palpation over the left second MCP.  Procedure: Real-time Ultrasound Guided Injection of left second metacarpal phalangeal joint Device: GE Logiq E  Verbal informed consent obtained.  Time-out conducted.  Noted no overlying erythema, induration, or other signs of local infection.  Skin prepped in a sterile fashion.  Local anesthesia: Topical Ethyl chloride.  With sterile technique and under real time ultrasound guidance:  30-gauge needle advanced into the joint, 0.5 mL kenalog 40, 0.5 mL lidocaine injected easily. Completed without difficulty  Pain immediately resolved suggesting accurate placement of  the medication.  Advised to call if fevers/chills, erythema, induration, drainage, or persistent bleeding.  Images permanently stored and available for review in the ultrasound unit.  Impression: Technically successful ultrasound guided injection.  Impression and Recommendations:

## 2014-03-15 NOTE — Assessment & Plan Note (Signed)
Feeling much better. Off of amitriptyline, she can restart this should her pain return.

## 2014-03-18 ENCOUNTER — Ambulatory Visit: Payer: BC Managed Care – PPO | Admitting: Sports Medicine

## 2014-04-08 ENCOUNTER — Encounter: Payer: Self-pay | Admitting: Physician Assistant

## 2014-04-08 ENCOUNTER — Ambulatory Visit (INDEPENDENT_AMBULATORY_CARE_PROVIDER_SITE_OTHER): Payer: BC Managed Care – PPO

## 2014-04-08 ENCOUNTER — Ambulatory Visit (INDEPENDENT_AMBULATORY_CARE_PROVIDER_SITE_OTHER): Payer: BC Managed Care – PPO | Admitting: Physician Assistant

## 2014-04-08 VITALS — BP 132/86 | HR 68 | Ht 63.0 in | Wt 110.0 lb

## 2014-04-08 DIAGNOSIS — M79642 Pain in left hand: Secondary | ICD-10-CM

## 2014-04-08 DIAGNOSIS — Z720 Tobacco use: Secondary | ICD-10-CM

## 2014-04-08 DIAGNOSIS — R5383 Other fatigue: Secondary | ICD-10-CM

## 2014-04-08 DIAGNOSIS — Z1239 Encounter for other screening for malignant neoplasm of breast: Secondary | ICD-10-CM | POA: Diagnosis not present

## 2014-04-08 DIAGNOSIS — M79641 Pain in right hand: Secondary | ICD-10-CM

## 2014-04-08 DIAGNOSIS — Z0000 Encounter for general adult medical examination without abnormal findings: Secondary | ICD-10-CM

## 2014-04-08 DIAGNOSIS — R634 Abnormal weight loss: Secondary | ICD-10-CM

## 2014-04-08 DIAGNOSIS — F17200 Nicotine dependence, unspecified, uncomplicated: Secondary | ICD-10-CM

## 2014-04-08 LAB — VITAMIN B12: Vitamin B-12: 532 pg/mL (ref 211–911)

## 2014-04-08 NOTE — Progress Notes (Signed)
Subjective:     Debra Massey is a 43 y.o. female and is here for a comprehensive physical exam. The patient reports problems - pt concerned with not being able to gain weight. denies GI problems, indigestion or any abnormal pain. she admits she eats "like a bird". she often skips meals. she does eat high calorie foods when she does eat. she works 2 jobs. she is fatigued off and on. she has a lot of hand pain from arhtirits. never been checked for RA would like to be.  she has had morning where she wakes up in 7/10 pain and stiffness of bilateral hands.   History   Social History  . Marital Status: Married    Spouse Name: N/A    Number of Children: N/A  . Years of Education: N/A   Occupational History  . Not on file.   Social History Main Topics  . Smoking status: Current Every Day Smoker -- 1.00 packs/day for 27 years    Types: Cigarettes  . Smokeless tobacco: Not on file  . Alcohol Use: No  . Drug Use: No  . Sexual Activity: Not on file   Other Topics Concern  . Not on file   Social History Narrative   Health Maintenance  Topic Date Due  . MAMMOGRAM  12/28/1989  . PAP SMEAR  12/28/1989  . INFLUENZA VACCINE  04/09/2015 (Originally 12/15/2013)  . TETANUS/TDAP  05/17/2020    The following portions of the patient's history were reviewed and updated as appropriate: allergies, current medications, past family history, past medical history, past social history, past surgical history and problem list.  Review of Systems A comprehensive review of systems was negative.   Objective:    BP 132/86 mmHg  Pulse 68  Ht 5\' 3"  (1.6 m)  Wt 110 lb (49.896 kg)  BMI 19.49 kg/m2 General appearance: alert, cooperative, appears stated age and fatigued Head: Normocephalic, without obvious abnormality, atraumatic Eyes: conjunctivae/corneas clear. PERRL, EOM's intact. Fundi benign. Ears: normal TM's and external ear canals both ears Nose: Nares normal. Septum midline. Mucosa normal. No  drainage or sinus tenderness. Throat: lips, mucosa, and tongue normal; teeth and gums normal Neck: no adenopathy, no carotid bruit, no JVD, supple, symmetrical, trachea midline and thyroid not enlarged, symmetric, no tenderness/mass/nodules Back: symmetric, no curvature. ROM normal. No CVA tenderness. Lungs: clear to auscultation bilaterally Heart: regular rate and rhythm, S1, S2 normal, no murmur, click, rub or gallop Abdomen: soft, non-tender; bowel sounds normal; no masses,  no organomegaly Extremities: extremities normal, atraumatic, no cyanosis or edema Pulses: 2+ and symmetric Skin: Skin color, texture, turgor normal. No rashes or lesions Lymph nodes: Cervical, supraclavicular, and axillary nodes normal. Neurologic: Grossly normal    Assessment:    Healthy female exam.     Plan:    CPE- ordered mammogram. Discussed need for pap smear. Ordered fasting labs. Discussed need for calcium 1200mg  and vitamin D 800 units daily. HO given. Discussed quiting smoking. Pt declined today.   Weight loss- at first appeared to be a weight loss of 17lbs in one month. Rechecked weight and only 5lb weight loss in one month. Pt is smoker will get CXR. Will test labs for fatigue and thyroid for any abnormality. Other vitamins were also checked. Follow up with weight check in 4 weeks. I am concerned pt just doesn't eat encough. Would like for pt to log calories for one week. Need to be hitting 2000calories.   Bilateral hand pain- currently being treated for osteoarthritis  by Dr. Karie Schwalbe. Pt concern about RA discussed unlikely but added ana and RF factor.  See After Visit Summary for Counseling Recommendations

## 2014-04-08 NOTE — Patient Instructions (Signed)

## 2014-04-09 LAB — LIPID PANEL
Cholesterol: 153 mg/dL (ref 0–200)
HDL: 67 mg/dL (ref >39)
LDL CALC: 75 mg/dL (ref 0–99)
Total CHOL/HDL Ratio: 2.3 Ratio
Triglycerides: 53 mg/dL (ref <150)
VLDL: 11 mg/dL (ref 0–40)

## 2014-04-09 LAB — COMPLETE METABOLIC PANEL WITH GFR
ALT: 13 U/L (ref 0–35)
AST: 17 U/L (ref 0–37)
Albumin: 4.9 g/dL (ref 3.5–5.2)
Alkaline Phosphatase: 37 U/L — ABNORMAL LOW (ref 39–117)
BUN: 10 mg/dL (ref 6–23)
CALCIUM: 9.5 mg/dL (ref 8.4–10.5)
CO2: 23 meq/L (ref 19–32)
CREATININE: 0.75 mg/dL (ref 0.50–1.10)
Chloride: 105 mEq/L (ref 96–112)
GFR, Est Non African American: >89 mL/min
GLUCOSE: 88 mg/dL (ref 70–99)
Potassium: 4.2 mEq/L (ref 3.5–5.3)
SODIUM: 140 meq/L (ref 135–145)
Total Bilirubin: 0.5 mg/dL (ref 0.2–1.2)
Total Protein: 7.1 g/dL (ref 6.0–8.3)

## 2014-04-09 LAB — RHEUMATOID FACTOR: Rhuematoid fact SerPl-aCnc: <10 IU/mL (ref <=14)

## 2014-04-09 LAB — CBC WITH DIFFERENTIAL/PLATELET
BASOS ABS: 0.1 10*3/uL (ref 0.0–0.1)
Basophils Relative: 1 % (ref 0–1)
Eosinophils Absolute: 0.1 10*3/uL (ref 0.0–0.7)
Eosinophils Relative: 1 % (ref 0–5)
HEMATOCRIT: 45.2 % (ref 36.0–46.0)
Hemoglobin: 15.2 g/dL — ABNORMAL HIGH (ref 12.0–15.0)
Lymphocytes Relative: 34 % (ref 12–46)
Lymphs Abs: 2.1 10*3/uL (ref 0.7–4.0)
MCH: 29.5 pg (ref 26.0–34.0)
MCHC: 33.6 g/dL (ref 30.0–36.0)
MCV: 87.8 fL (ref 78.0–100.0)
MPV: 10.2 fL (ref 9.4–12.4)
Monocytes Absolute: 0.5 10*3/uL (ref 0.1–1.0)
Monocytes Relative: 8 % (ref 3–12)
Neutro Abs: 3.5 10*3/uL (ref 1.7–7.7)
Neutrophils Relative %: 56 % (ref 43–77)
PLATELETS: 254 10*3/uL (ref 150–400)
RBC: 5.15 MIL/uL — ABNORMAL HIGH (ref 3.87–5.11)
RDW: 13.3 % (ref 11.5–15.5)
WBC: 6.2 10*3/uL (ref 4.0–10.5)

## 2014-04-09 LAB — SEDIMENTATION RATE: SED RATE: 1 mm/h (ref 0–22)

## 2014-04-09 LAB — FERRITIN: FERRITIN: 19 ng/mL (ref 10–291)

## 2014-04-09 LAB — ANA: Anti Nuclear Antibody(ANA): NEGATIVE

## 2014-04-09 LAB — TSH: TSH: 1.712 u[IU]/mL (ref 0.350–4.500)

## 2014-04-09 LAB — T4, FREE: Free T4: 1.21 ng/dL (ref 0.80–1.80)

## 2014-04-09 LAB — VITAMIN D 25 HYDROXY (VIT D DEFICIENCY, FRACTURES): Vit D, 25-Hydroxy: 33 ng/mL (ref 30–100)

## 2014-04-12 ENCOUNTER — Ambulatory Visit: Payer: BC Managed Care – PPO | Admitting: Sports Medicine

## 2014-04-18 ENCOUNTER — Other Ambulatory Visit: Payer: Self-pay | Admitting: *Deleted

## 2014-04-18 MED ORDER — CELECOXIB 200 MG PO CAPS: ORAL_CAPSULE | ORAL | Status: DC

## 2014-06-18 ENCOUNTER — Other Ambulatory Visit: Payer: Self-pay | Admitting: *Deleted

## 2014-06-18 DIAGNOSIS — M5441 Lumbago with sciatica, right side: Secondary | ICD-10-CM

## 2014-06-18 MED ORDER — AMITRIPTYLINE HCL 50 MG PO TABS: 50.0000 mg | ORAL_TABLET | Freq: Two times a day (BID) | ORAL | Status: DC

## 2014-06-18 MED ORDER — CELECOXIB 200 MG PO CAPS: ORAL_CAPSULE | ORAL | Status: DC

## 2014-06-25 ENCOUNTER — Telehealth: Payer: Self-pay

## 2014-06-25 NOTE — Telephone Encounter (Signed)
[employer] states the PA was denied. I started a PCR urgent request. This may take a few day for provider review.

## 2014-06-25 NOTE — Telephone Encounter (Signed)
Patient called and left a message asking if the PA for Celebrex has been approved. The PA was started on 06/20/2014. I will call to find out why it is taking so long.

## 2014-07-02 NOTE — Telephone Encounter (Signed)
[facility] and the Celecoxib is approved for the dates of 06/20/2014 -05/16/2038 - CF

## 2014-07-15 ENCOUNTER — Ambulatory Visit (INDEPENDENT_AMBULATORY_CARE_PROVIDER_SITE_OTHER): Payer: BLUE CROSS/BLUE SHIELD | Admitting: Sports Medicine

## 2014-07-15 ENCOUNTER — Encounter: Payer: Self-pay | Admitting: Sports Medicine

## 2014-07-15 VITALS — BP 123/82 | HR 79 | Ht 63.0 in | Wt 115.0 lb

## 2014-07-15 DIAGNOSIS — M5441 Lumbago with sciatica, right side: Secondary | ICD-10-CM | POA: Diagnosis not present

## 2014-07-15 NOTE — Assessment & Plan Note (Signed)
[name] returns with questions regarding thoracic degenerative disc disease noted incidentally on a chest x-ray. She does have the well known right-sided L4 radiculopathy from a broad-based disc protrusion that has been fairly well controlled with Celebrex and amitriptyline. Recently she did have a right-sided upper extremity radicular symptoms, with numbness and tingling on the medial aspect of her right upper arm, and numbness of all of the fingers. I explained the anatomy, and help thoracic degenerative disc disease would probably be unrelated, with the exception of the T1 nerve root, and this likely was related to cervical degenerative disc disease.

## 2014-07-15 NOTE — Progress Notes (Signed)
  Subjective:    CC: Follow-up  HPI: Right arm numbness and tingling: [name] will occasionally get episodes of right arm numbness and tingling with a shooting sensation into her right upper arm. She did have a chest x-ray for another reason, and it was noted that she had mid to lower thoracic degenerative changes. She wonders if this is the cause. Symptoms are moderate, intermittent, and not particularly bothersome.  Right lumbar radiculopathy: She did have a broad-based L4-5 disc protrusion that seems relatively well controlled with amitriptyline and occasional Celebrex.  Past medical history, Surgical history, Family history not pertinant except as noted below, Social history, Allergies, and medications have been entered into the medical record, reviewed, and no changes needed.   Review of Systems: No fevers, chills, night sweats, weight loss, chest pain, or shortness of breath.   Objective:    General: Well Developed, well nourished, and in no acute distress.  Neuro: Alert and oriented x3, extra-ocular muscles intact, sensation grossly intact.  HEENT: Normocephalic, atraumatic, pupils equal round reactive to light, neck supple, no masses, no lymphadenopathy, thyroid nonpalpable.  Skin: Warm and dry, no rashes. Cardiac: Regular rate and rhythm, no murmurs rubs or gallops, no lower extremity edema.  Respiratory: Clear to auscultation bilaterally. Not using accessory muscles, speaking in full sentences. Neck: Negative spurling's Full neck range of motion Grip strength and sensation normal in bilateral hands Strength good C4 to T1 distribution No sensory change to C4 to T1 Reflexes normal  I reviewed her thoracic spine x-rays, it does appear that she has significant mid and lower thoracic degenerative changes, there was however no imaging of the cervical spine were think the issues are, either way plan does not change.  Impression and Recommendations:

## 2014-09-06 ENCOUNTER — Other Ambulatory Visit: Payer: Self-pay | Admitting: *Deleted

## 2014-09-06 MED ORDER — TRAMADOL HCL 50 MG PO TABS: ORAL_TABLET | ORAL | Status: DC

## 2014-09-06 MED ORDER — METRONIDAZOLE 1 % EX GEL: Freq: Every day | CUTANEOUS | Status: DC

## 2014-12-03 ENCOUNTER — Ambulatory Visit (INDEPENDENT_AMBULATORY_CARE_PROVIDER_SITE_OTHER): Payer: BLUE CROSS/BLUE SHIELD | Admitting: Sports Medicine

## 2014-12-03 VITALS — BP 117/79 | HR 66 | Ht 63.0 in | Wt 112.0 lb

## 2014-12-03 DIAGNOSIS — M19042 Primary osteoarthritis, left hand: Secondary | ICD-10-CM | POA: Diagnosis not present

## 2014-12-03 DIAGNOSIS — M19041 Primary osteoarthritis, right hand: Secondary | ICD-10-CM

## 2014-12-03 NOTE — Progress Notes (Signed)
  Subjective:    CC: left hand pain  HPI: Left hand osteoarthritis: Did well after a right-sided second metacarpal phalangeal joint injection over a year ago, she is now having pain at the left second metacarpal phalangeal joint, moderate, persistent without radiation, worse with attempts to abduct the second MCP.  Past medical history, Surgical history, Family history not pertinant except as noted below, Social history, Allergies, and medications have been entered into the medical record, reviewed, and no changes needed.   Review of Systems: No fevers, chills, night sweats, weight loss, chest pain, or shortness of breath.   Objective:    General: Well Developed, well nourished, and in no acute distress.  Neuro: Alert and oriented x3, extra-ocular muscles intact, sensation grossly intact.  HEENT: Normocephalic, atraumatic, pupils equal round reactive to light, neck supple, no masses, no lymphadenopathy, thyroid nonpalpable.  Skin: Warm and dry, no rashes. Cardiac: Regular rate and rhythm, no murmurs rubs or gallops, no lower extremity edema.  Respiratory: Clear to auscultation bilaterally. Not using accessory muscles, speaking in full sentences. Left hand: Tender to palpation at the second MCP, particularly with varus and valgus stress. Minimal swelling.  Procedure: Real-time Ultrasound Guided Injection of left second metacarpal phalangeal joint Device: GE Logiq E  Verbal informed consent obtained.  Time-out conducted.  Noted no overlying erythema, induration, or other signs of local infection.  Skin prepped in a sterile fashion.  Local anesthesia: Topical Ethyl chloride.  With sterile technique and under real time ultrasound guidance:  Using a 30-gauge needle and injected 0.5 mL kenalog 40, 0.5 mL lidocaine easily. Completed without difficulty  Pain immediately resolved suggesting accurate placement of the medication.  Advised to call if fevers/chills, erythema, induration, drainage,  or persistent bleeding.  Images permanently stored and available for review in the ultrasound unit.  Impression: Technically successful ultrasound guided injection.  Impression and Recommendations:   I spent 25 minutes with this patient, greater than 50% was face-to-face time counseling regarding the above diagnoses

## 2014-12-03 NOTE — Assessment & Plan Note (Signed)
Right second MCP injection last year, left second MCP injection today per patient request. Continue Celebrex.

## 2014-12-17 ENCOUNTER — Telehealth: Payer: Self-pay | Admitting: *Deleted

## 2014-12-17 DIAGNOSIS — F17200 Nicotine dependence, unspecified, uncomplicated: Secondary | ICD-10-CM | POA: Insufficient documentation

## 2014-12-17 MED ORDER — BUPROPION HCL ER (XL) 150 MG PO TB24: 150.0000 mg | ORAL_TABLET | ORAL | Status: DC

## 2014-12-17 NOTE — Telephone Encounter (Signed)
Rx called in 

## 2014-12-17 NOTE — Assessment & Plan Note (Signed)
Starting Wellbutrin, return to see PCP in one month

## 2014-12-17 NOTE — Telephone Encounter (Signed)
Pt called today stating that she would like to go back on wellbutrin for smoking cessation.  Lesly Rubenstein gave her this a few years back.  Would she have to schedule an appt or can we just send this in? Please advise.

## 2014-12-18 NOTE — Telephone Encounter (Signed)
Pt notified of rx. 

## 2014-12-26 ENCOUNTER — Telehealth: Payer: Self-pay | Admitting: Sports Medicine

## 2014-12-26 MED ORDER — PREDNISONE 10 MG (21) PO TBPK: ORAL_TABLET | ORAL | Status: DC

## 2014-12-26 NOTE — Telephone Encounter (Signed)
C8, T1 cervical radiculopathy, adding a short course of prednisone, and cervical spine redilatation exercises.

## 2015-05-16 ENCOUNTER — Encounter: Payer: Self-pay | Admitting: Physician Assistant

## 2015-05-16 ENCOUNTER — Ambulatory Visit (INDEPENDENT_AMBULATORY_CARE_PROVIDER_SITE_OTHER): Payer: BLUE CROSS/BLUE SHIELD | Admitting: Physician Assistant

## 2015-05-16 VITALS — BP 137/69 | HR 63 | Ht 63.0 in | Wt 113.0 lb

## 2015-05-16 DIAGNOSIS — F17200 Nicotine dependence, unspecified, uncomplicated: Secondary | ICD-10-CM

## 2015-05-16 DIAGNOSIS — Z720 Tobacco use: Secondary | ICD-10-CM

## 2015-05-16 DIAGNOSIS — L089 Local infection of the skin and subcutaneous tissue, unspecified: Secondary | ICD-10-CM | POA: Diagnosis not present

## 2015-05-16 DIAGNOSIS — L918 Other hypertrophic disorders of the skin: Secondary | ICD-10-CM | POA: Diagnosis not present

## 2015-05-16 MED ORDER — BUPROPION HCL ER (XL) 150 MG PO TB24: 150.0000 mg | ORAL_TABLET | ORAL | Status: DC

## 2015-05-16 NOTE — Patient Instructions (Signed)
Keep covered and dry for next 48 hours.  bactroban as needed.

## 2015-05-19 NOTE — Progress Notes (Signed)
   Subjective:    Patient ID: Romie JumperStacy Kotara, female    DOB: July 31, 1970, 45 y.o.   MRN: [id number]  HPI Pt is a 45 yo female who presents to the clinic for 2 skin tags that get caught on her bra. They are located under both breast. She has had for many years but recently seemed to get a little bigger and now irritated by rubbing against bra. Left breast skin tag has bled before.    Review of Systems See HPI.    Objective:   Physical Exam  Pulmonary/Chest:            Assessment & Plan:  Inflamed skin tags- removed today. Did not send for biopsy. Reassured patient that is benign.   Skin Tag Removal Procedure Note  Pre-operative Diagnosis: Classic skin tags (acrochordon)  Post-operative Diagnosis: Classic skin tags (acrochordon)  Locations:underneath bilateral breast  Indications: inflamed  Anesthesia: ethyl chloride  Procedure Details  The risks (including bleeding and infection) and benefits of the procedure and Verbal informed consent obtained. Using sterile iris scissors, multiple skin tags were snipped off at their bases after cleansing with Betadine.  Bleeding was controlled by pressure.   Findings: Pathognomonic benign lesions  not sent for pathological exam.  Condition: Stable  Complications: none.  Plan: 1. Instructed to keep the wounds dry and covered for 24-48h and clean thereafter. 2. Warning signs of infection were reviewed.   3. Recommended that the patient use OTC acetaminophen as needed for pain.  4. Return as needed.  Smoker- pt would like wellbutrin refilled.

## 2015-06-26 ENCOUNTER — Other Ambulatory Visit: Payer: Self-pay | Admitting: *Deleted

## 2015-06-26 DIAGNOSIS — M5441 Lumbago with sciatica, right side: Secondary | ICD-10-CM

## 2015-06-26 MED ORDER — CELECOXIB 200 MG PO CAPS: ORAL_CAPSULE | ORAL | Status: DC

## 2015-06-26 MED ORDER — TRAMADOL HCL 50 MG PO TABS
ORAL_TABLET | ORAL | Status: DC
Start: 2015-06-26 — End: 2016-05-03

## 2015-06-26 MED ORDER — AMITRIPTYLINE HCL 50 MG PO TABS: 50.0000 mg | ORAL_TABLET | Freq: Two times a day (BID) | ORAL | Status: DC

## 2015-08-01 ENCOUNTER — Other Ambulatory Visit: Payer: Self-pay | Admitting: *Deleted

## 2015-08-01 MED ORDER — DICLOFENAC SODIUM 2 % TD SOLN: 1.0000 "application " | Freq: Two times a day (BID) | TRANSDERMAL | Status: DC | PRN

## 2015-08-05 ENCOUNTER — Other Ambulatory Visit: Payer: Self-pay | Admitting: Physician Assistant

## 2015-08-05 MED ORDER — ALUMINUM CHLORIDE 20 % EX SOLN: CUTANEOUS | Status: DC

## 2015-08-05 NOTE — Progress Notes (Signed)
Pt complains of sweating a lot. As it gets hotter she is sweating more.

## 2015-08-12 ENCOUNTER — Encounter: Payer: Self-pay | Admitting: Sports Medicine

## 2015-08-12 ENCOUNTER — Ambulatory Visit (INDEPENDENT_AMBULATORY_CARE_PROVIDER_SITE_OTHER): Payer: BLUE CROSS/BLUE SHIELD | Admitting: Sports Medicine

## 2015-08-12 VITALS — BP 128/78 | HR 76 | Resp 18 | Wt 117.0 lb

## 2015-08-12 DIAGNOSIS — M19041 Primary osteoarthritis, right hand: Secondary | ICD-10-CM | POA: Diagnosis not present

## 2015-08-12 DIAGNOSIS — M19042 Primary osteoarthritis, left hand: Secondary | ICD-10-CM

## 2015-08-12 NOTE — Progress Notes (Signed)
  Subjective:    CC: left hand pain  HPI: This is a pleasant 45 year old female with known hand osteoarthritis, she is a hairstylist. Her previous injection was approximately 7-8 months ago, she desires repeat injection today.  Also has some pain at the base of the metacarpals. Pain is moderate, persistent without radiation.  Past medical history, Surgical history, Family history not pertinant except as noted below, Social history, Allergies, and medications have been entered into the medical record, reviewed, and no changes needed.   Review of Systems: No fevers, chills, night sweats, weight loss, chest pain, or shortness of breath.   Objective:    General: Well Developed, well nourished, and in no acute distress.  Neuro: Alert and oriented x3, extra-ocular muscles intact, sensation grossly intact.  HEENT: Normocephalic, atraumatic, pupils equal round reactive to light, neck supple, no masses, no lymphadenopathy, thyroid nonpalpable.  Skin: Warm and dry, no rashes. Cardiac: Regular rate and rhythm, no murmurs rubs or gallops, no lower extremity edema.  Respiratory: Clear to auscultation bilaterally. Not using accessory muscles, speaking in full sentences.  Procedure: Real-time Ultrasound Guided Injection of Left second MCP Device: GE Logiq E  Verbal informed consent obtained.  Time-out conducted.  Noted no overlying erythema, induration, or other signs of local infection.  Skin prepped in a sterile fashion.  Local anesthesia: Topical Ethyl chloride.  With sterile technique and under real time ultrasound guidance:  Using a 30-gauge needle injected 1/2 mL kenalog 40, 1/2 mL lidocaine easily. Completed without difficulty  Pain immediately resolved suggesting accurate placement of the medication.  Advised to call if fevers/chills, erythema, induration, drainage, or persistent bleeding.  Images permanently stored and available for review in the ultrasound unit.  Impression: Technically  successful ultrasound guided injection.  Procedure: Real-time Ultrasound Guided Injection of left trapezoidometacarpal joint Device: GE Logiq E  Verbal informed consent obtained.  Time-out conducted.  Noted no overlying erythema, induration, or other signs of local infection.  Skin prepped in a sterile fashion.  Local anesthesia: Topical Ethyl chloride.  With sterile technique and under real time ultrasound guidance:  Using a 30-gauge needle injected 1/2 mL kenalog 40, 1/2 mL lidocaine easily. Completed without difficulty  Pain immediately resolved suggesting accurate placement of the medication.  Advised to call if fevers/chills, erythema, induration, drainage, or persistent bleeding.  Images permanently stored and available for review in the ultrasound unit.  Impression: Technically successful ultrasound guided injection.  Impression and Recommendations:

## 2015-08-12 NOTE — Assessment & Plan Note (Addendum)
Eight-month response to previous injection, repeat left second MCP injection as above, injection also placed in the trapezoidometacarpal joint, return as needed.

## 2015-08-18 ENCOUNTER — Other Ambulatory Visit: Payer: Self-pay | Admitting: *Deleted

## 2015-08-18 MED ORDER — ALPRAZOLAM 0.5 MG PO TABS: 0.5000 mg | ORAL_TABLET | ORAL | Status: DC | PRN

## 2015-08-18 MED ORDER — ALPRAZOLAM 0.5 MG PO TBDP: 0.5000 mg | ORAL_TABLET | ORAL | Status: DC | PRN

## 2015-09-04 ENCOUNTER — Telehealth: Payer: Self-pay | Admitting: *Deleted

## 2015-09-04 MED ORDER — ONDANSETRON 8 MG PO TBDP
8.0000 mg | ORAL_TABLET | Freq: Three times a day (TID) | ORAL | Status: DC | PRN
Start: 2015-09-04 — End: 2016-02-09

## 2015-09-04 NOTE — Telephone Encounter (Signed)
No chest pain, diaphoresis, shortness of breath. Symptoms likely represent a viral enteritis. Adding Zofran, patient needs to hydrate, offered in office IV hydration, she can consider this if no improvement over the next day or so.

## 2015-09-04 NOTE — Telephone Encounter (Signed)
Pt calls today c/o of acid reflux sx starting last night.  She has nausea, has vomited once, headache, & weakness.  Denies chest pain, palpitations, or diaphoresis.

## 2015-09-05 ENCOUNTER — Other Ambulatory Visit: Payer: Self-pay | Admitting: *Deleted

## 2015-09-05 MED ORDER — HYDROCODONE-ACETAMINOPHEN 5-325 MG PO TABS: 1.0000 | ORAL_TABLET | Freq: Two times a day (BID) | ORAL | Status: DC | PRN

## 2015-09-13 ENCOUNTER — Other Ambulatory Visit: Payer: Self-pay | Admitting: Physician Assistant

## 2015-10-24 ENCOUNTER — Other Ambulatory Visit: Payer: Self-pay | Admitting: Sports Medicine

## 2015-10-24 MED ORDER — PREDNISONE 50 MG PO TABS: ORAL_TABLET | ORAL | Status: DC

## 2015-12-10 ENCOUNTER — Encounter: Payer: Self-pay | Admitting: Sports Medicine

## 2016-02-09 ENCOUNTER — Ambulatory Visit (INDEPENDENT_AMBULATORY_CARE_PROVIDER_SITE_OTHER): Payer: BLUE CROSS/BLUE SHIELD | Admitting: Physician Assistant

## 2016-02-09 ENCOUNTER — Encounter: Payer: Self-pay | Admitting: Physician Assistant

## 2016-02-09 VITALS — BP 119/70 | HR 90 | Ht 63.0 in | Wt 110.0 lb

## 2016-02-09 DIAGNOSIS — Z1231 Encounter for screening mammogram for malignant neoplasm of breast: Secondary | ICD-10-CM | POA: Diagnosis not present

## 2016-02-09 DIAGNOSIS — R634 Abnormal weight loss: Secondary | ICD-10-CM

## 2016-02-09 DIAGNOSIS — F411 Generalized anxiety disorder: Secondary | ICD-10-CM | POA: Diagnosis not present

## 2016-02-09 MED ORDER — DESVENLAFAXINE SUCCINATE ER 50 MG PO TB24: 50.0000 mg | ORAL_TABLET | Freq: Every day | ORAL | 1 refills | Status: DC

## 2016-02-09 MED ORDER — MEGESTROL ACETATE 400 MG/10ML PO SUSP
400.0000 mg | Freq: Every day | ORAL | 2 refills | Status: DC
Start: 2016-02-09 — End: 2016-07-19

## 2016-02-09 NOTE — Progress Notes (Addendum)
Subjective:     Patient ID: Debra Massey, female   DOB: April 13, 1971, 45 y.o.   MRN: [id number]  HPI Patient is a 45 y.o. Caucasian female presenting today with complaints of increased anxiety. The patient notes that it is particularly bad while at work and that she has been having severe mood swings. The patient states that her anxiety symptoms are mostly associated with emotion especially since her brother passed away. The patient states that she was previously treated with Pristiq with a good response but did not do as well on Zoloft. The patient is a smoker and states that the past 3-4 weeks her anxiety symptoms and smoking habits have worsened. The patient states that she is currently smoking under 1 pack per day. The patient denies shortness of breath, chest pain, fever, night sweats, or palpitations. The patient admits to unintentional weight loss of 7lbs.  Review of Systems  Constitutional: Negative.   HENT: Negative.   Eyes: Negative.   Respiratory: Negative for cough, chest tightness, shortness of breath and wheezing.   Cardiovascular: Negative for chest pain, palpitations and leg swelling.  Gastrointestinal: Negative.   Endocrine: Negative.   Genitourinary: Negative.   Musculoskeletal: Negative.   Skin: Negative.   Allergic/Immunologic: Positive for environmental allergies.  Neurological: Negative for dizziness, syncope, weakness, light-headedness, numbness and headaches.  Psychiatric/Behavioral: Positive for agitation and behavioral problems. Negative for confusion, decreased concentration, dysphoric mood, hallucinations, self-injury, sleep disturbance and suicidal ideas. The patient is nervous/anxious.       Objective:   Physical Exam  Constitutional: She is oriented to person, place, and time. She appears well-developed and well-nourished. No distress.  HENT:  Head: Normocephalic and atraumatic.  Right Ear: External ear normal.  Left Ear: External ear normal.  Nose: Nose  normal.  Mouth/Throat: Oropharynx is clear and moist. No oropharyngeal exudate.  Eyes: Conjunctivae and EOM are normal. Pupils are equal, round, and reactive to light. Right eye exhibits no discharge. Left eye exhibits no discharge. No scleral icterus.  Neck: Normal range of motion. Neck supple. No JVD present. No tracheal deviation present. No thyromegaly present.  Cardiovascular: Normal rate, regular rhythm, normal heart sounds and intact distal pulses.  Exam reveals no gallop and no friction rub.   No murmur heard. Pulmonary/Chest: Effort normal and breath sounds normal. No stridor. No respiratory distress. She has no wheezes. She has no rales. She exhibits no tenderness.  Abdominal: Soft. Bowel sounds are normal. She exhibits no distension and no mass. There is no tenderness. There is no rebound and no guarding.  Lymphadenopathy:    She has no cervical adenopathy.  Neurological: She is alert and oriented to person, place, and time. No cranial nerve deficit. Coordination normal.  Skin: Skin is warm and dry. No rash noted. She is not diaphoretic. No erythema. No pallor.  Psychiatric: She has a normal mood and affect. Her behavior is normal. Judgment and thought content normal.      Assessment:     Diagnoses and all orders for this visit:  Generalized anxiety disorder -     desvenlafaxine (PRISTIQ) 50 MG 24 hr tablet; Take 1 tablet (50 mg total) by mouth daily.  Visit for screening mammogram -     MM DIGITAL SCREENING BILATERAL; Future -     MM DIGITAL SCREENING BILATERAL  Weight loss -     megestrol (MEGACE) 400 MG/10ML suspension; Take 10 mLs (400 mg total) by mouth daily.      Plan:     1.  GAD - Patient with PHQ-9 score of 2 and GAD-7 score of 7 in-office today. Patient to start on Pristiq 50mg  tablets once daily. Discussed with patient the importance of implementing lifestyle changes and seeking counseling if her symptoms persist. Patient to follow-up in 2-4 weeks for medication  management.   2. Weight loss - Patient with an unintentional weight loss of 7 lbs since her last visit. Patient has struggled with maintaining appropriate weight. Patient to start on Megace 400mg /4mL. Patient had previous laboratory work-up with no noted abnormalities. Encouraged a diet of at least 2000 calories.   Summary - Patient given order to have screening bilateral mammogram at next available. Will contact patient with imaging results.

## 2016-03-02 ENCOUNTER — Ambulatory Visit (INDEPENDENT_AMBULATORY_CARE_PROVIDER_SITE_OTHER): Payer: BLUE CROSS/BLUE SHIELD

## 2016-03-02 DIAGNOSIS — Z1231 Encounter for screening mammogram for malignant neoplasm of breast: Secondary | ICD-10-CM | POA: Diagnosis not present

## 2016-03-24 ENCOUNTER — Telehealth: Payer: Self-pay | Admitting: *Deleted

## 2016-03-24 NOTE — Telephone Encounter (Signed)
Pt wanted to know if you would be willing to give her Tylenol 3 to use for pain instead of tramadol. Please advise.

## 2016-03-24 NOTE — Telephone Encounter (Signed)
Pt agrees to try the increase in tramadol.

## 2016-03-24 NOTE — Telephone Encounter (Signed)
That's getting into true narcotics and isn't a good idea for chronic pain.  My advice would be to increase tramadol to 100mg  TID. Let me know if this is ok with her.

## 2016-04-15 ENCOUNTER — Other Ambulatory Visit: Payer: Self-pay | Admitting: Physician Assistant

## 2016-04-15 ENCOUNTER — Telehealth: Payer: Self-pay | Admitting: *Deleted

## 2016-04-15 DIAGNOSIS — F411 Generalized anxiety disorder: Secondary | ICD-10-CM

## 2016-04-15 DIAGNOSIS — M79642 Pain in left hand: Secondary | ICD-10-CM

## 2016-04-15 NOTE — Telephone Encounter (Signed)
Orders placed, she may get her x-rays when desired

## 2016-04-15 NOTE — Telephone Encounter (Signed)
Pt wanted to know if you could order xrays for her left hand & wrist due to increasing pain.  She has an appointment with Lesly RubensteinJade on Monday & can get them done then.  Pt will then schedule appointment with you for possible injections.  Please advise.

## 2016-04-16 NOTE — Telephone Encounter (Signed)
Pt.notified

## 2016-04-19 ENCOUNTER — Ambulatory Visit (INDEPENDENT_AMBULATORY_CARE_PROVIDER_SITE_OTHER): Payer: BLUE CROSS/BLUE SHIELD | Admitting: Physician Assistant

## 2016-04-19 ENCOUNTER — Encounter: Payer: Self-pay | Admitting: Physician Assistant

## 2016-04-19 ENCOUNTER — Ambulatory Visit (INDEPENDENT_AMBULATORY_CARE_PROVIDER_SITE_OTHER): Payer: BLUE CROSS/BLUE SHIELD

## 2016-04-19 VITALS — BP 108/78 | HR 81 | Ht 63.0 in | Wt 110.0 lb

## 2016-04-19 DIAGNOSIS — F411 Generalized anxiety disorder: Secondary | ICD-10-CM

## 2016-04-19 DIAGNOSIS — M19041 Primary osteoarthritis, right hand: Secondary | ICD-10-CM

## 2016-04-19 DIAGNOSIS — M79642 Pain in left hand: Secondary | ICD-10-CM

## 2016-04-19 DIAGNOSIS — M25532 Pain in left wrist: Secondary | ICD-10-CM

## 2016-04-19 DIAGNOSIS — M19042 Primary osteoarthritis, left hand: Secondary | ICD-10-CM

## 2016-04-19 MED ORDER — DESVENLAFAXINE SUCCINATE ER 50 MG PO TB24: 50.0000 mg | ORAL_TABLET | Freq: Every day | ORAL | 5 refills | Status: DC

## 2016-04-19 NOTE — Progress Notes (Signed)
   Subjective:    Patient ID: Debra Massey, female    DOB: [birth year], 45 y.o.   MRN: [id number]  HPI Pt is presenting to clinic to follow up on GAD and starting pristiq. She is doing great on current dose. She feels a lot less moody and irritable. She has no concerns or complaints.  She continues to have pain in left hand and wrist. Seems to be localized around first MCP joint and radial wrist. She admits she is not taking celebrex regularly and does help when she takes it. She notices pain when she is holding dryer in her hand at work.   Review of Systems  All other systems reviewed and are negative.      Objective:   Physical Exam  Constitutional: She is oriented to person, place, and time. She appears well-developed and well-nourished.  HENT:  Head: Normocephalic and atraumatic.  Cardiovascular: Normal rate, regular rhythm and normal heart sounds.   Pulmonary/Chest: Effort normal and breath sounds normal.  Musculoskeletal:  No tenderness to palpation over bony landmarks of both hands and wrist.  Negative finklestein.  Normal ROM of bilateral hands.   Neurological: She is alert and oriented to person, place, and time.  Psychiatric: She has a normal mood and affect. Her behavior is normal.          Assessment & Plan:  [name].[name].Debra Massey was seen today for anxiety.  Diagnoses and all orders for this visit:  Generalized anxiety disorder -     desvenlafaxine (PRISTIQ) 50 MG 24 hr tablet; Take 1 tablet (50 mg total) by mouth daily.  Primary osteoarthritis of both hands   PHQ-9 was 1 and GAD-7 was 4. Refilled pristiq for 6 months.   Pt will follow up with Dr. Karie Schwalbe after xray of wrist. Discussed taking celebrex one tablet twice a day. She has pennsaid at home and combine up to twice a day. Reassured patient appears to be osteoarthritis.

## 2016-04-19 NOTE — Patient Instructions (Signed)
pennsaid up to twice a day.  celebrex up to twice a day.

## 2016-04-26 ENCOUNTER — Ambulatory Visit (INDEPENDENT_AMBULATORY_CARE_PROVIDER_SITE_OTHER): Payer: BLUE CROSS/BLUE SHIELD | Admitting: Sports Medicine

## 2016-04-26 ENCOUNTER — Encounter: Payer: Self-pay | Admitting: Sports Medicine

## 2016-04-26 DIAGNOSIS — M19042 Primary osteoarthritis, left hand: Secondary | ICD-10-CM | POA: Diagnosis not present

## 2016-04-26 DIAGNOSIS — F17200 Nicotine dependence, unspecified, uncomplicated: Secondary | ICD-10-CM

## 2016-04-26 DIAGNOSIS — M19041 Primary osteoarthritis, right hand: Secondary | ICD-10-CM

## 2016-04-26 MED ORDER — VARENICLINE TARTRATE 0.5 MG X 11 & 1 MG X 42 PO MISC: ORAL | 0 refills | Status: DC

## 2016-04-26 MED ORDER — VARENICLINE TARTRATE 1 MG PO TABS: 1.0000 mg | ORAL_TABLET | Freq: Two times a day (BID) | ORAL | 3 refills | Status: DC

## 2016-04-26 NOTE — Assessment & Plan Note (Signed)
Starting Chantix. Discount coupon given. 

## 2016-04-26 NOTE — Progress Notes (Signed)
  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 No trauma, worse with activity. She has used her oral NSAIDs without much improvement.  Smoker: Agreeable to try Chantix. Insufficient results with Wellbutrin.  Past medical history:  Negative.  See flowsheet/record as well for more information.  Surgical history: Negative.  See flowsheet/record as well for more information.  Family history: Negative.  See flowsheet/record as well for more information.  Social history: Negative.  See flowsheet/record as well for more information.  Allergies, and medications have been entered into the medical record, reviewed, and no changes needed.   Review of Systems: No fevers, chills, night sweats, weight loss, chest pain, or shortness of breath.   Objective:    General: Well Developed, well nourished, and in no acute distress.  Neuro: Alert and oriented x3, extra-ocular muscles intact, sensation grossly intact.  HEENT: Normocephalic, atraumatic, pupils equal round reactive to light, neck supple, no masses, no lymphadenopathy, thyroid nonpalpable.  Skin: Warm and dry, no rashes. Cardiac: Regular rate and rhythm, no murmurs rubs or gallops, no lower extremity edema.  Respiratory: Clear to auscultation bilaterally. Not using accessory muscles, speaking in full sentences. Left Wrist: Inspection normal with no visible erythema or swelling. ROM smooth and normal with good flexion and extension and ulnar/radial deviation that is symmetrical with opposite wrist. Tender to palpation directly over the radioscaphoid joint, no tenderness over the scaphotrapezial joint, and only minimal tenderness over the trapeziometacarpal joint. No snuffbox tenderness. No  tenderness over [place]. Strength 5/5 in all directions without pain. Negative Finkelstein, tinel's and phalens. Negative Watson's test.  Procedure: Real-time Ultrasound Guided Injection of left radioscaphoid joint Device: GE Logiq E  Verbal informed consent obtained.  Time-out conducted.  Noted no overlying erythema, induration, or other signs of local infection.  Skin prepped in a sterile fashion.  Local anesthesia: Topical Ethyl chloride.  With sterile technique and under real time ultrasound guidance:  25-gauge needle advanced into the joint and 1 mL kenalog 40, 1 mL lidocaine injected easily. Completed without difficulty  Pain immediately resolved suggesting accurate placement of the medication.  Advised to call if fevers/chills, erythema, induration, drainage, or persistent bleeding.  Images permanently stored and available for review in the ultrasound unit.  Impression: Technically successful ultrasound guided injection.  Impression and Recommendations:    Primary osteoarthritis of both hands Pain today is predominantly at the left radioscaphoid joint. Oral NSAIDs aren't helping, injection placed into the joint. Return in one month.  Smoker Starting Chantix. Discount coupon given.

## 2016-04-26 NOTE — Assessment & Plan Note (Signed)
Pain today is predominantly at the left radioscaphoid joint. Oral NSAIDs aren't helping, injection placed into the joint. Return in one month.

## 2016-04-27 ENCOUNTER — Other Ambulatory Visit: Payer: Self-pay | Admitting: *Deleted

## 2016-04-27 MED ORDER — DICLOFENAC SODIUM 2 % TD SOLN: TRANSDERMAL | 2 refills | Status: DC

## 2016-04-29 ENCOUNTER — Telehealth: Payer: Self-pay | Admitting: *Deleted

## 2016-04-29 MED ORDER — METHOCARBAMOL 500 MG PO TABS: 500.0000 mg | ORAL_TABLET | Freq: Three times a day (TID) | ORAL | 0 refills | Status: DC

## 2016-04-29 NOTE — Telephone Encounter (Signed)
Pt calls requesting to try a muscle relaxer for her back.  She wants to know if robaxin will help and not be sedating so she can take it while working.  If so, would you be willing to send this in for her? [facility] [address] is the pharmacy.  Please advise

## 2016-04-29 NOTE — Telephone Encounter (Signed)
Rx sent 

## 2016-04-30 ENCOUNTER — Ambulatory Visit (INDEPENDENT_AMBULATORY_CARE_PROVIDER_SITE_OTHER): Payer: BLUE CROSS/BLUE SHIELD | Admitting: Physician Assistant

## 2016-04-30 ENCOUNTER — Encounter: Payer: Self-pay | Admitting: Physician Assistant

## 2016-04-30 VITALS — BP 133/71 | HR 67 | Ht 63.0 in | Wt 111.0 lb

## 2016-04-30 DIAGNOSIS — H02401 Unspecified ptosis of right eyelid: Secondary | ICD-10-CM

## 2016-04-30 DIAGNOSIS — H53451 Other localized visual field defect, right eye: Secondary | ICD-10-CM | POA: Diagnosis not present

## 2016-04-30 MED ORDER — GABAPENTIN 100 MG PO CAPS: 100.0000 mg | ORAL_CAPSULE | Freq: Every day | ORAL | 0 refills | Status: DC

## 2016-04-30 MED ORDER — METHOCARBAMOL 500 MG PO TABS: 500.0000 mg | ORAL_TABLET | Freq: Three times a day (TID) | ORAL | 0 refills | Status: DC

## 2016-04-30 NOTE — Progress Notes (Signed)
   Subjective:    Patient ID: Debra JumperStacy Massey, female    DOB: 08-10-1970, 45 y.o.   MRN: [id number]  HPI  Pt is a 45 yo female who presents to the clinic with episodic symptoms of right eyelid drooping. She has had this occasionally for over 6 months but seems to become more frequent recently. The other day at work a customer asked her what was wrong with her eye. She denies any numbness, tingling, pain, eye twitching, slurred speech, upper extremity or facial weakness. She did have one episode back in the summer or peripheral vision loss in right eye laterally. Vision came back with in a few hours and has never happened again. No dizziness or headache.     Review of Systems  All other systems reviewed and are negative.      Objective:   Physical Exam  Constitutional: She is oriented to person, place, and time. She appears well-developed and well-nourished.  HENT:  Head: Normocephalic and atraumatic.  Right Ear: External ear normal.  Left Ear: External ear normal.  Nose: Nose normal.  Mouth/Throat: Oropharynx is clear and moist. No oropharyngeal exudate.  No tenderness to palpation to facial area. No asymmetry today.   Eyes: Conjunctivae and EOM are normal. Pupils are equal, round, and reactive to light.  Peripheral vision intact bilaterally.   Neck: Normal range of motion. Neck supple.  Cardiovascular: Normal rate, regular rhythm and normal heart sounds.   Pulmonary/Chest: Effort normal and breath sounds normal.  Lymphadenopathy:    She has no cervical adenopathy.  Neurological: She is alert and oriented to person, place, and time.  Psychiatric: She has a normal mood and affect. Her behavior is normal.          Assessment & Plan:  [name].[name].Diagnoses and all orders for this visit:  Drooping eyelid, right -     gabapentin (NEURONTIN) 100 MG capsule; Take 1 capsule (100 mg total) by mouth at bedtime.  Other orders -     Discontinue: methocarbamol (ROBAXIN) 500 MG tablet; Take 1  tablet (500 mg total) by mouth 3 (three) times daily. -     methocarbamol (ROBAXIN) 500 MG tablet; Take 1 tablet (500 mg total) by mouth 3 (three) times daily.   Reassured patient not appearing like a stroke. Reviewed stroke symptoms.  Almost appears like bells palsy but should not be so limited to right eyelid only and come and go.  Neurontin given to start at night. Could help with some of her pain as well if any nerve involvement of right eye.  Due to one episode of peripheral vision loss suggested eye appt to evaluate and look at overall eye health.  Continue to monitor if continues or worsens follow up.  Cannot exclude myasthenia gravis.

## 2016-05-03 ENCOUNTER — Other Ambulatory Visit: Payer: Self-pay | Admitting: Sports Medicine

## 2016-05-03 DIAGNOSIS — H53451 Other localized visual field defect, right eye: Secondary | ICD-10-CM | POA: Insufficient documentation

## 2016-05-03 DIAGNOSIS — H02401 Unspecified ptosis of right eyelid: Secondary | ICD-10-CM | POA: Insufficient documentation

## 2016-06-17 ENCOUNTER — Other Ambulatory Visit: Payer: Self-pay | Admitting: Physician Assistant

## 2016-06-17 DIAGNOSIS — H02401 Unspecified ptosis of right eyelid: Secondary | ICD-10-CM

## 2016-06-21 ENCOUNTER — Other Ambulatory Visit: Payer: Self-pay | Admitting: *Deleted

## 2016-06-21 DIAGNOSIS — H02401 Unspecified ptosis of right eyelid: Secondary | ICD-10-CM

## 2016-06-21 MED ORDER — GABAPENTIN 100 MG PO CAPS: 100.0000 mg | ORAL_CAPSULE | Freq: Every day | ORAL | 11 refills | Status: DC

## 2016-07-05 DIAGNOSIS — H40013 Open angle with borderline findings, low risk, bilateral: Secondary | ICD-10-CM | POA: Diagnosis not present

## 2016-07-19 ENCOUNTER — Ambulatory Visit (INDEPENDENT_AMBULATORY_CARE_PROVIDER_SITE_OTHER): Payer: BLUE CROSS/BLUE SHIELD | Admitting: Sports Medicine

## 2016-07-19 ENCOUNTER — Ambulatory Visit (INDEPENDENT_AMBULATORY_CARE_PROVIDER_SITE_OTHER): Payer: BLUE CROSS/BLUE SHIELD

## 2016-07-19 DIAGNOSIS — M24852 Other specific joint derangements of left hip, not elsewhere classified: Secondary | ICD-10-CM | POA: Diagnosis not present

## 2016-07-19 DIAGNOSIS — M25512 Pain in left shoulder: Secondary | ICD-10-CM

## 2016-07-19 DIAGNOSIS — M5136 Other intervertebral disc degeneration, lumbar region: Secondary | ICD-10-CM | POA: Diagnosis not present

## 2016-07-19 DIAGNOSIS — M51369 Other intervertebral disc degeneration, lumbar region without mention of lumbar back pain or lower extremity pain: Secondary | ICD-10-CM

## 2016-07-19 MED ORDER — PREDNISONE 50 MG PO TABS: ORAL_TABLET | ORAL | 0 refills | Status: DC

## 2016-07-19 MED ORDER — TRAMADOL HCL 50 MG PO TABS: 50.0000 mg | ORAL_TABLET | Freq: Three times a day (TID) | ORAL | 3 refills | Status: DC | PRN

## 2016-07-19 NOTE — Assessment & Plan Note (Signed)
Anterior and clinically represents long head of the biceps tendinitis. Rehabilitation exercises given, x-rays, return in one month, injection if no better.

## 2016-07-19 NOTE — Progress Notes (Signed)
   Subjective:    I'm seeing this patient as a consultation for:  Tandy GawJade Breeback, PA-C  CC: Multiple issues   HPI: Low back pain: Known L4-L5 disc, broad-based, pain is axial, discogenic. Nothing radicular currently.  Left shoulder pain: Present only for a couple of days, anterior, worse with lifting, moderate, persistent, no radiation.  Left hip pain: Localized anteriorly in the groin with popping. No trauma.  Past medical history:  Negative.  See flowsheet/record as well for more information.  Surgical history: Negative.  See flowsheet/record as well for more information.  Family history: Negative.  See flowsheet/record as well for more information.  Social history: Negative.  See flowsheet/record as well for more information.  Allergies, and medications have been entered into the medical record, reviewed, and no changes needed.   Review of Systems: No headache, visual changes, nausea, vomiting, diarrhea, constipation, dizziness, abdominal pain, skin rash, fevers, chills, night sweats, weight loss, swollen lymph nodes, body aches, joint swelling, muscle aches, chest pain, shortness of breath, mood changes, visual or auditory hallucinations.   Objective:   General: Well Developed, well nourished, and in no acute distress.  Neuro/Psych: Alert and oriented x3, extra-ocular muscles intact, able to move all 4 extremities, sensation grossly intact. Skin: Warm and dry, no rashes noted.  Respiratory: Not using accessory muscles, speaking in full sentences, trachea midline.  Cardiovascular: Pulses palpable, no extremity edema. Abdomen: Does not appear distended. Left Shoulder: Inspection reveals no abnormalities, atrophy or asymmetry. Tender to palpation over the bicipital groove ROM is full in all planes. Rotator cuff strength normal throughout. No signs of impingement with negative Neer and Hawkin's tests, empty can. Speeds and Yergason's tests both positive No labral pathology noted  with negative Obrien's, negative crank, negative clunk, and good stability. Normal scapular function observed. No painful arc and no drop arm sign. No apprehension sign Left Hip: ROM IR: 60 Deg, ER: 60 Deg, Flexion: 120 Deg, Extension: 100 Deg, Abduction: 45 Deg, Adduction: 45 Deg Strength IR: 5/5, ER: 5/5, Flexion: 5/5, Extension: 5/5, Abduction: 5/5, Adduction: 5/5 Pelvic alignment unremarkable to inspection and palpation. Standing hip rotation and gait without trendelenburg / unsteadiness. Greater trochanter without tenderness to palpation. No tenderness over piriformis. No SI joint tenderness and normal minimal SI movement. Reproduction of pain with a palpable pop with taking the hip from FABER to neutral.  Impression and Recommendations:   This case required medical decision making of moderate complexity.  Lumbar degenerative disc disease Known right-sided L4 radiculopathy from a broad-based protruding disc at L4-L5 on an MRI from 2009. Having recurrence of discogenic axial back pain without radiculopathy. 5 days of prednisone, postural training, return to see me in one month, new MRI for interventional planning if no better. We will continue Celebrex, diclofenac was not effective in the past.  Left shoulder pain Anterior and clinically represents long head of the biceps tendinitis. Rehabilitation exercises given, x-rays, return in one month, injection if no better.  Left internal coxa saltans Hip flexor rehabilitation exercises, return in one month for this.

## 2016-07-19 NOTE — Assessment & Plan Note (Signed)
Known right-sided L4 radiculopathy from a broad-based protruding disc at L4-L5 on an MRI from 2009. Having recurrence of discogenic axial back pain without radiculopathy. 5 days of prednisone, postural training, return to see me in one month, new MRI for interventional planning if no better. We will continue Celebrex, diclofenac was not effective in the past.

## 2016-07-19 NOTE — Assessment & Plan Note (Signed)
Hip flexor rehabilitation exercises, return in one month for this.

## 2016-08-09 ENCOUNTER — Ambulatory Visit (INDEPENDENT_AMBULATORY_CARE_PROVIDER_SITE_OTHER): Payer: BLUE CROSS/BLUE SHIELD | Admitting: Sports Medicine

## 2016-08-09 DIAGNOSIS — M19042 Primary osteoarthritis, left hand: Secondary | ICD-10-CM

## 2016-08-09 DIAGNOSIS — M51369 Other intervertebral disc degeneration, lumbar region without mention of lumbar back pain or lower extremity pain: Secondary | ICD-10-CM

## 2016-08-09 DIAGNOSIS — M5136 Other intervertebral disc degeneration, lumbar region: Secondary | ICD-10-CM | POA: Diagnosis not present

## 2016-08-09 DIAGNOSIS — M25512 Pain in left shoulder: Secondary | ICD-10-CM | POA: Diagnosis not present

## 2016-08-09 DIAGNOSIS — M24852 Other specific joint derangements of left hip, not elsewhere classified: Secondary | ICD-10-CM

## 2016-08-09 DIAGNOSIS — M19041 Primary osteoarthritis, right hand: Secondary | ICD-10-CM | POA: Diagnosis not present

## 2016-08-09 NOTE — Assessment & Plan Note (Signed)
Clinical resemble bicipital tendinosis, resolved with rehabilitation exercises

## 2016-08-09 NOTE — Assessment & Plan Note (Signed)
Left MCP injection. Return as needed.

## 2016-08-09 NOTE — Progress Notes (Signed)
  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 Unfortunately Celebrex and activity modification have been ineffective. She desires aggressive interventional treatment today.  Past medical history:  Negative.  See flowsheet/record as well for more information.  Surgical history: Negative.  See flowsheet/record as well for more information.  Family history: Negative.  See flowsheet/record as well for more information.  Social history: Negative.  See flowsheet/record as well for more information.  Allergies, and medications have been entered into the medical record, reviewed, and no changes needed.   Review of Systems: No fevers, chills, night sweats, weight loss, chest pain, or shortness of breath.   Objective:    General: Well Developed, well nourished, and in no acute distress.  Neuro: Alert and oriented x3, extra-ocular muscles intact, sensation grossly intact.  HEENT: Normocephalic, atraumatic, pupils equal round reactive to light, neck supple, no masses, no lymphadenopathy, thyroid nonpalpable.  Skin: Warm and dry, no rashes. Cardiac: Regular rate and rhythm, no murmurs rubs or gallops, no lower extremity edema.  Respiratory: Clear to auscultation bilaterally. Not using accessory muscles, speaking in full sentences. Left hand: Visibly swollen second MCP with tenderness to palpation.  Procedure: Real-time Ultrasound Guided Injection of left second MCP Device: GE Logiq E  Verbal informed consent obtained.  Time-out conducted.  Noted no overlying erythema, induration, or other signs of local infection.  Skin prepped in a sterile fashion.  Local anesthesia: Topical Ethyl chloride.  With sterile technique and under real time ultrasound guidance:  1/2 mL Kenalog 40, 1/2 mL lidocaine injected  easily Completed without difficulty  Pain immediately resolved suggesting accurate placement of the medication.  Advised to call if fevers/chills, erythema, induration, drainage, or persistent bleeding.  Images permanently stored and available for review in the ultrasound unit.  Impression: Technically successful ultrasound guided injection.  Impression and Recommendations:    Primary osteoarthritis of both hands Left MCP injection. Return as needed.  Left internal coxa saltans Resolved with prednisone and hip rehabilitation exercises  Left shoulder pain Clinical resemble bicipital tendinosis, resolved with rehabilitation exercises  Lumbar degenerative disc disease Known right-sided L4 radiculopathy from a disc protrusion, broad-based L4-L5 on an MRI from 2009. Discogenic axial back pain without radiculopathy had recurred at the last visit, has resolved with prednisone, postural training. We continued with Celebrex, diclofenac was not effective in the past.

## 2016-08-09 NOTE — Assessment & Plan Note (Signed)
Resolved with prednisone and hip rehabilitation exercises

## 2016-08-09 NOTE — Assessment & Plan Note (Signed)
Known right-sided L4 radiculopathy from a disc protrusion, broad-based L4-L5 on an MRI from 2009. Discogenic axial back pain without radiculopathy had recurred at the last visit, has resolved with prednisone, postural training. We continued with Celebrex, diclofenac was not effective in the past.

## 2016-11-25 ENCOUNTER — Telehealth: Payer: Self-pay | Admitting: *Deleted

## 2016-11-25 MED ORDER — LIDOCAINE 5 % EX PTCH: 1.0000 | MEDICATED_PATCH | Freq: Two times a day (BID) | CUTANEOUS | 2 refills | Status: DC

## 2016-11-25 NOTE — Addendum Note (Signed)
Addended by: Monica BectonHEKKEKANDAM, Niaya Hickok J on: 11/25/2016 05:11 PM   Modules accepted: Orders

## 2016-11-25 NOTE — Telephone Encounter (Signed)
Pt is requesting that you send in lidocaine patches for her back.  [facility] [address]  Thanks.

## 2016-11-26 ENCOUNTER — Other Ambulatory Visit: Payer: Self-pay | Admitting: *Deleted

## 2016-11-26 NOTE — Telephone Encounter (Signed)
Pt notified me that a PA is required so she will try otc patches instead.

## 2017-01-25 ENCOUNTER — Other Ambulatory Visit: Payer: Self-pay | Admitting: *Deleted

## 2017-01-25 DIAGNOSIS — F17200 Nicotine dependence, unspecified, uncomplicated: Secondary | ICD-10-CM

## 2017-01-25 MED ORDER — CELECOXIB 200 MG PO CAPS: ORAL_CAPSULE | ORAL | 11 refills | Status: DC

## 2017-01-25 MED ORDER — VARENICLINE TARTRATE 0.5 MG X 11 & 1 MG X 42 PO MISC: ORAL | 0 refills | Status: DC

## 2017-02-28 ENCOUNTER — Telehealth: Payer: Self-pay | Admitting: *Deleted

## 2017-02-28 DIAGNOSIS — Z1239 Encounter for other screening for malignant neoplasm of breast: Secondary | ICD-10-CM

## 2017-02-28 NOTE — Telephone Encounter (Signed)
Mammogram ordered

## 2017-03-23 ENCOUNTER — Telehealth: Payer: Self-pay | Admitting: *Deleted

## 2017-03-23 ENCOUNTER — Ambulatory Visit (INDEPENDENT_AMBULATORY_CARE_PROVIDER_SITE_OTHER): Payer: BLUE CROSS/BLUE SHIELD

## 2017-03-23 DIAGNOSIS — M25542 Pain in joints of left hand: Secondary | ICD-10-CM | POA: Diagnosis not present

## 2017-03-23 DIAGNOSIS — M79642 Pain in left hand: Secondary | ICD-10-CM | POA: Diagnosis not present

## 2017-03-23 DIAGNOSIS — S6992XA Unspecified injury of left wrist, hand and finger(s), initial encounter: Secondary | ICD-10-CM

## 2017-03-23 NOTE — Telephone Encounter (Signed)
Pt requesting x-ray of left hand due to an injury a few days ago and "excrutiating pain".

## 2017-03-28 ENCOUNTER — Telehealth: Payer: Self-pay | Admitting: *Deleted

## 2017-03-28 ENCOUNTER — Ambulatory Visit (INDEPENDENT_AMBULATORY_CARE_PROVIDER_SITE_OTHER): Payer: BLUE CROSS/BLUE SHIELD | Admitting: Sports Medicine

## 2017-03-28 DIAGNOSIS — F17200 Nicotine dependence, unspecified, uncomplicated: Secondary | ICD-10-CM

## 2017-03-28 DIAGNOSIS — R5383 Other fatigue: Secondary | ICD-10-CM

## 2017-03-28 DIAGNOSIS — Z131 Encounter for screening for diabetes mellitus: Secondary | ICD-10-CM

## 2017-03-28 DIAGNOSIS — Z13220 Encounter for screening for lipoid disorders: Secondary | ICD-10-CM

## 2017-03-28 DIAGNOSIS — S61412A Laceration without foreign body of left hand, initial encounter: Secondary | ICD-10-CM | POA: Diagnosis not present

## 2017-03-28 DIAGNOSIS — Z299 Encounter for prophylactic measures, unspecified: Secondary | ICD-10-CM

## 2017-03-28 MED ORDER — BUPROPION HCL ER (SMOKING DET) 150 MG PO TB12: ORAL_TABLET | ORAL | 3 refills | Status: DC

## 2017-03-28 MED ORDER — HYDROCODONE-ACETAMINOPHEN 5-325 MG PO TABS: 1.0000 | ORAL_TABLET | Freq: Three times a day (TID) | ORAL | 0 refills | Status: DC | PRN

## 2017-03-28 NOTE — Assessment & Plan Note (Signed)
Declines Chantix this time around, adding Wellbutrin.

## 2017-03-28 NOTE — Telephone Encounter (Signed)
Labs ordered for upcoming CPE. 

## 2017-03-28 NOTE — Progress Notes (Signed)
  Subjective:    CC: Skin tear  HPI: A week and 3 days ago this pleasant 46 year old female had a patron lean back on her hand tearing the skin.  She had immediate pain, she was seen when x-rays were negative at the site of injury and the site of pain.  Unfortunately she has had persistent pain, severe over the skin tear.  Continues to smoke, did not have a good response to Chantix.  Past medical history:  Negative.  See flowsheet/record as well for more information.  Surgical history: Negative.  See flowsheet/record as well for more information.  Family history: Negative.  See flowsheet/record as well for more information.  Social history: Negative.  See flowsheet/record as well for more information.  Allergies, and medications have been entered into the medical record, reviewed, and no changes needed.   Review of Systems: No fevers, chills, night sweats, weight loss, chest pain, or shortness of breath.   Objective:    General: Well Developed, well nourished, and in no acute distress.  Neuro: Alert and oriented x3, extra-ocular muscles intact, sensation grossly intact.  HEENT: Normocephalic, atraumatic, pupils equal round reactive to light, neck supple, no masses, no lymphadenopathy, thyroid nonpalpable.  Skin: Warm and dry, no rashes. Cardiac: Regular rate and rhythm, no murmurs rubs or gallops, no lower extremity edema.  Respiratory: Clear to auscultation bilaterally. Not using accessory muscles, speaking in full sentences.. Left hand: Small superficial skin avulsion, only minimal surrounding edema consistent with inflammation without infection, I applied Dermabond, the  wound was no longer tender to palpation.  Wrist is with full range of motion, full strength with no tenderness over the second MCP, or the carpometacarpal, intercarpal or radiocarpal joints.  Impression and Recommendations:    Skin tear of left hand without complication Negative x-rays, healing well. Dermabond  applied. She can use a glove while at work. This will heal faster if she stopped smoking so I will help her with smoking cessation. Yes, we are using hydrocodone for a small skin tear...  Smoker Declines Chantix this time around, adding Wellbutrin.  ___________________________________________ Ihor Austinhomas J. Benjamin Stainhekkekandam, M.D., ABFM., CAQSM. Primary Care and Sports Medicine [place] [facility]  Adjunct Instructor of Family Medicine  [employer]

## 2017-03-28 NOTE — Assessment & Plan Note (Addendum)
Negative x-rays, healing well. Dermabond applied. She can use a glove while at work. This will heal faster if she stopped smoking so I will help her with smoking cessation. Yes, we are using hydrocodone for a small skin tear.Debra Massey..Debra Massey

## 2017-04-04 ENCOUNTER — Encounter: Payer: Self-pay | Admitting: Physician Assistant

## 2017-04-04 ENCOUNTER — Other Ambulatory Visit (HOSPITAL_COMMUNITY)
Admission: RE | Admit: 2017-04-04 | Discharge: 2017-04-04 | Disposition: A | Discharge status: Home / Self Care | Payer: BLUE CROSS/BLUE SHIELD | Source: Ambulatory Visit | Attending: Physician Assistant | Admitting: Physician Assistant

## 2017-04-04 ENCOUNTER — Ambulatory Visit (INDEPENDENT_AMBULATORY_CARE_PROVIDER_SITE_OTHER): Payer: BLUE CROSS/BLUE SHIELD | Admitting: Physician Assistant

## 2017-04-04 VITALS — BP 118/70 | HR 55 | Ht 63.0 in | Wt 119.0 lb

## 2017-04-04 DIAGNOSIS — Z131 Encounter for screening for diabetes mellitus: Secondary | ICD-10-CM | POA: Diagnosis not present

## 2017-04-04 DIAGNOSIS — Z01419 Encounter for gynecological examination (general) (routine) without abnormal findings: Secondary | ICD-10-CM | POA: Insufficient documentation

## 2017-04-04 DIAGNOSIS — R87810 Cervical high risk human papillomavirus (HPV) DNA test positive: Secondary | ICD-10-CM | POA: Diagnosis not present

## 2017-04-04 DIAGNOSIS — F17200 Nicotine dependence, unspecified, uncomplicated: Secondary | ICD-10-CM | POA: Diagnosis not present

## 2017-04-04 DIAGNOSIS — R5383 Other fatigue: Secondary | ICD-10-CM | POA: Diagnosis not present

## 2017-04-04 DIAGNOSIS — Z13220 Encounter for screening for lipoid disorders: Secondary | ICD-10-CM | POA: Diagnosis not present

## 2017-04-04 NOTE — Patient Instructions (Signed)

## 2017-04-04 NOTE — Progress Notes (Signed)
Subjective:     Debra Massey is a 46 y.o. female and is here for a comprehensive physical exam. The patient reports no problems.  Pt had her last period in June. She recently started zyban for smoking cessation. She is down to 2-3 cigs a day but occasional vaping.   Social History   Socioeconomic History  . Marital status: Married    Spouse name: Not on file  . Number of children: Not on file  . Years of education: Not on file  . Highest education level: Not on file  Social Needs  . Financial resource strain: Not on file  . Food insecurity - worry: Not on file  . Food insecurity - inability: Not on file  . Transportation needs - medical: Not on file  . Transportation needs - non-medical: Not on file  Occupational History  . Not on file  Tobacco Use  . Smoking status: Current Every Day Smoker    Packs/day: 1.00    Years: 27.00    Pack years: 27.00    Types: Cigarettes, E-cigarettes  . Smokeless tobacco: Never Used  Substance and Sexual Activity  . Alcohol use: No  . Drug use: No  . Sexual activity: Not on file  Other Topics Concern  . Not on file  Social History Narrative  . Not on file   Health Maintenance  Topic Date Due  . HIV Screening  12/29/1986  . PAP SMEAR  12/28/1992  . MAMMOGRAM  03/02/2017  . INFLUENZA VACCINE  08/28/2017 (Originally 12/15/2016)  . TETANUS/TDAP  05/17/2020    The following portions of the patient's history were reviewed and updated as appropriate: allergies, current medications, past family history, past medical history, past social history, past surgical history and problem list.  Review of Systems A comprehensive review of systems was negative.   Objective:    BP 118/70   Pulse (!) 55   Ht 5\' 3"  (1.6 m)   Wt 119 lb (54 kg)   BMI 21.08 kg/m  General appearance: alert, cooperative and appears stated age Head: Normocephalic, without obvious abnormality, atraumatic Eyes: conjunctivae/corneas clear. PERRL, EOM's intact. Fundi  benign. Ears: normal TM's and external ear canals both ears Nose: Nares normal. Septum midline. Mucosa normal. No drainage or sinus tenderness. Throat: lips, mucosa, and tongue normal; teeth and gums normal Neck: no adenopathy, no carotid bruit, no JVD, supple, symmetrical, trachea midline and thyroid not enlarged, symmetric, no tenderness/mass/nodules Back: symmetric, no curvature. ROM normal. No CVA tenderness. Lungs: clear to auscultation bilaterally Breasts: normal appearance, no masses or tenderness Heart: regular rate and rhythm, S1, S2 normal, no murmur, click, rub or gallop Abdomen: soft, non-tender; bowel sounds normal; no masses,  no organomegaly Pelvic: external genitalia normal, no adnexal masses or tenderness, no cervical motion tenderness, uterus normal size, shape, and consistency, vagina normal without discharge and cervix appears friable.  Extremities: extremities normal, atraumatic, no cyanosis or edema Pulses: 2+ and symmetric Skin: Skin color, texture, turgor normal. No rashes or lesions Lymph nodes: Cervical, supraclavicular, and axillary nodes normal. Neurologic: Alert and oriented X 3, normal strength and tone. Normal symmetric reflexes. Normal coordination and gait    Assessment:    Healthy female exam.     Plan:     Debra Massey.Debra Massey.Debra ArnoldStacy was seen today for annual exam and gynecologic exam.  Diagnoses and all orders for this visit:  Encounter for well woman exam with routine gynecological exam -     Cytology - PAP  Smoker   ..Debra Massey  [facility] 04/04/2017 02/09/2016  Decreased Interest 0 0  Down, Depressed, Hopeless 0 0  PHQ - 2 Score 0 0  Altered sleeping - 0  Tired, decreased energy - 1  Change in appetite - 1  Feeling bad or failure about yourself  - 0  Trouble concentrating - 0  Moving slowly or fidgety/restless - 0  Suicidal thoughts - 0  PHQ-9 Score - 2   .Debra Massey. Discussed 150 minutes of exercise a week.  Encouraged vitamin D 1000 units and Calcium  1300mg  or 4 servings of dairy a day.   Continue on zyban to help taper off cigs and stop smoking.   Pt has mammogram ordered she just needs to schedule.  Pt done today. Declined STD testing.   Pt declines flu shot today but will come back to get when she is not so busy at work.    See After Visit Summary for Counseling Recommendations

## 2017-04-06 LAB — COMPLETE METABOLIC PANEL WITH GFR
AG RATIO: 2.2 (calc) (ref 1.0–2.5)
ALT: 16 U/L (ref 6–29)
AST: 16 U/L (ref 10–35)
Albumin: 4.8 g/dL (ref 3.6–5.1)
Alkaline phosphatase (APISO): 47 U/L (ref 33–115)
BUN: 13 mg/dL (ref 7–25)
CALCIUM: 9.6 mg/dL (ref 8.6–10.2)
CHLORIDE: 106 mmol/L (ref 98–110)
CO2: 27 mmol/L (ref 20–32)
CREATININE: 0.91 mg/dL (ref 0.50–1.10)
GFR, Est African American: 88 mL/min/{1.73_m2} (ref >=60)
GFR, Est Non African American: 76 mL/min/{1.73_m2} (ref >=60)
Globulin: 2.2 g/dL (calc) (ref 1.9–3.7)
Glucose, Bld: 100 mg/dL — ABNORMAL HIGH (ref 65–99)
Potassium: 4.4 mmol/L (ref 3.5–5.3)
Sodium: 140 mmol/L (ref 135–146)
TOTAL PROTEIN: 7 g/dL (ref 6.1–8.1)
Total Bilirubin: 0.3 mg/dL (ref 0.2–1.2)

## 2017-04-06 LAB — TEST AUTHORIZATION

## 2017-04-06 LAB — LIPID PANEL W/REFLEX DIRECT LDL
CHOL/HDL RATIO: 2.2 (calc) (ref <5.0)
Cholesterol: 158 mg/dL (ref <200)
HDL: 72 mg/dL (ref >50)
LDL CHOLESTEROL (CALC): 72 mg/dL
Non-HDL Cholesterol (Calc): 86 mg/dL (calc) (ref <130)
TRIGLYCERIDES: 64 mg/dL (ref <150)

## 2017-04-06 LAB — VITAMIN B12: VITAMIN B 12: 791 pg/mL (ref 200–1100)

## 2017-04-06 LAB — VITAMIN D 25 HYDROXY (VIT D DEFICIENCY, FRACTURES): Vit D, 25-Hydroxy: 36 ng/mL (ref 30–100)

## 2017-04-06 LAB — FERRITIN: FERRITIN: 19 ng/mL (ref 10–232)

## 2017-04-06 LAB — HEMOGLOBIN A1C W/OUT EAG

## 2017-04-06 LAB — TSH: TSH: 1.77 m[IU]/L

## 2017-04-08 LAB — CYTOLOGY - PAP
Diagnosis: NEGATIVE
HPV 16/18/45 GENOTYPING: NEGATIVE
HPV: DETECTED — AB

## 2017-04-10 ENCOUNTER — Encounter: Payer: Self-pay | Admitting: Physician Assistant

## 2017-04-10 DIAGNOSIS — R87618 Other abnormal cytological findings on specimens from cervix uteri: Secondary | ICD-10-CM | POA: Insufficient documentation

## 2017-04-10 DIAGNOSIS — R8789 Other abnormal findings in specimens from female genital organs: Secondary | ICD-10-CM | POA: Insufficient documentation

## 2017-04-11 ENCOUNTER — Ambulatory Visit: Payer: BLUE CROSS/BLUE SHIELD

## 2017-04-11 ENCOUNTER — Ambulatory Visit: Payer: BLUE CROSS/BLUE SHIELD | Admitting: Sports Medicine

## 2017-04-27 ENCOUNTER — Ambulatory Visit (INDEPENDENT_AMBULATORY_CARE_PROVIDER_SITE_OTHER): Payer: BLUE CROSS/BLUE SHIELD

## 2017-04-27 DIAGNOSIS — Z1231 Encounter for screening mammogram for malignant neoplasm of breast: Secondary | ICD-10-CM

## 2017-04-27 NOTE — Telephone Encounter (Signed)
Call pt: normal mammogram. Follow up in 1 year.

## 2017-06-17 ENCOUNTER — Other Ambulatory Visit: Payer: Self-pay | Admitting: *Deleted

## 2017-06-17 DIAGNOSIS — F17200 Nicotine dependence, unspecified, uncomplicated: Secondary | ICD-10-CM

## 2017-06-17 MED ORDER — BUPROPION HCL ER (SMOKING DET) 150 MG PO TB12: ORAL_TABLET | ORAL | 3 refills | Status: DC

## 2017-07-18 ENCOUNTER — Ambulatory Visit (INDEPENDENT_AMBULATORY_CARE_PROVIDER_SITE_OTHER): Payer: BLUE CROSS/BLUE SHIELD | Admitting: Sports Medicine

## 2017-07-18 ENCOUNTER — Encounter: Payer: Self-pay | Admitting: Sports Medicine

## 2017-07-18 DIAGNOSIS — M19042 Primary osteoarthritis, left hand: Secondary | ICD-10-CM

## 2017-07-18 DIAGNOSIS — M19041 Primary osteoarthritis, right hand: Secondary | ICD-10-CM | POA: Diagnosis not present

## 2017-07-18 MED ORDER — DICLOFENAC SODIUM 2 % TD SOLN: 2.0000 | Freq: Two times a day (BID) | TRANSDERMAL | 11 refills | Status: DC

## 2017-07-18 NOTE — Progress Notes (Signed)
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  Overall today she really does not have any pain at the MCP, or the wrist joint itself, has also noted that topical diclofenac has been effective in controlling her pain.  She last had an injection into the second MCP in March 2018.  I reviewed the past medical history, family history, social history, surgical history, and allergies today and no changes were needed.  Please see the problem list section below in epic for further details.  Past Medical History: No past medical history on file. Past Surgical History: Past Surgical History:  Procedure Laterality Date  . breast biospy  2008   benign  . CHOLECYSTECTOMY     2010  . laprascopy     Social History: Social History   Socioeconomic History  . Marital status: Married    Spouse name: None  . Number of children: None  . Years of education: None  . Highest education level: None  Social Needs  . Financial resource strain: None  . Food insecurity - worry: None  . Food insecurity - inability: None  . Transportation needs - medical: None  . Transportation needs - non-medical: None  Occupational History  . None  Tobacco Use  . Smoking status: Current Every Day Smoker    Packs/day: 1.00    Years: 27.00    Pack years: 27.00    Types: Cigarettes, E-cigarettes  . Smokeless tobacco: Never Used  Substance and Sexual Activity  . Alcohol use: No  . Drug use: No  . Sexual activity: None  Other Topics Concern  . None  Social History Narrative  . None   Family History: Family History  Problem Relation Age of Onset  . Cancer Mother        breast  . Diabetes Mother   . Hypertension Mother   . Diabetes Father    Allergies: Allergies  Allergen Reactions  .  Hydrocodone-Homatropine Other (See Comments)    Dizziness, almost passed out   Medications: See med rec.  Review of Systems: No fevers, chills, night sweats, weight loss, chest pain, or shortness of breath.   Objective:    General: Well Developed, well nourished, and in no acute distress.  Neuro: Alert and oriented x3, extra-ocular muscles intact, sensation grossly intact.  HEENT: Normocephalic, atraumatic, pupils equal round reactive to light, neck supple, no masses, no lymphadenopathy, thyroid nonpalpable.  Skin: Warm and dry, no rashes. Cardiac: Regular rate and rhythm, no murmurs rubs or gallops, no lower extremity edema.  Respiratory: Clear to auscultation bilaterally. Not using accessory muscles, speaking in full sentences. Left hand: Minimal fullness over the second MCP without tenderness, no tenderness over the wrist joint, full range of motion.  There is some atrophy of the skin with prominence of the veins and easy bruising over the dorsum of the second metacarpal, likely secondary to previous injections a year ago.  Impression and Recommendations:    Primary osteoarthritis of both hands Previous injection into the left second MCP was 1 year ago. Having a recurrence of pain, this time over the dorsum of the carpometacarpal joint. Is really not hurting that much today, so we are going to defer the injection. Topical Pennsaid (diclofenac 2% topical) has been efficacious so we are going to  feel that as well. She does have some post injection fat atrophy over the dorsum of the hand that is leading to easy bruising, I am going to minimize the number of injections we gave her from now on. ___________________________________________ Ihor Austin. Benjamin Stain, M.D., ABFM., CAQSM. Primary Care and Sports Medicine  [facility]  Adjunct Instructor of Family Medicine  [employer]

## 2017-07-18 NOTE — Assessment & Plan Note (Signed)
Previous injection into the left second MCP was 1 year ago. Having a recurrence of pain, this time over the dorsum of the carpometacarpal joint. Is really not hurting that much today, so we are going to defer the injection. Topical Pennsaid (diclofenac 2% topical) has been efficacious so we are going to feel that as well. She does have some post injection fat atrophy over the dorsum of the hand that is leading to easy bruising, I am going to minimize the number of injections we gave her from now on.

## 2017-08-25 ENCOUNTER — Other Ambulatory Visit: Payer: Self-pay | Admitting: *Deleted

## 2017-08-25 MED ORDER — ALUMINUM CHLORIDE 20 % EX SOLN: CUTANEOUS | 1 refills | Status: DC

## 2017-08-31 ENCOUNTER — Telehealth: Payer: Self-pay | Admitting: *Deleted

## 2017-08-31 MED ORDER — GLYCOPYRRONIUM TOSYLATE 2.4 % EX PADS: 1.0000 "application " | MEDICATED_PAD | Freq: Every day | CUTANEOUS | 5 refills | Status: DC

## 2017-08-31 NOTE — Telephone Encounter (Signed)
Ok I sent to pharmacy.  

## 2017-08-31 NOTE — Telephone Encounter (Signed)
Pt requested a refill of Drysol but pharmacy sent back a fax asking for us to change it to something else because Drysol is unavailable.  Pt saw a commercial for Qbrexza and was wondering if you could send that in for her.

## 2017-09-01 ENCOUNTER — Telehealth: Payer: Self-pay | Admitting: Physician Assistant

## 2017-09-01 ENCOUNTER — Other Ambulatory Visit: Payer: Self-pay | Admitting: Physician Assistant

## 2017-09-01 MED ORDER — ALUMINUM CHLORIDE IN ALCOHOL 6.25 % EX SOLN: 1.0000 "application " | Freq: Every day | CUTANEOUS | 5 refills | Status: DC

## 2017-09-01 NOTE — Telephone Encounter (Signed)
Approvedtoday  Effective from 09/01/2017 through 08/31/2018. Pharmacy notified.

## 2017-09-01 NOTE — Progress Notes (Signed)
drysol is unavailable. Sent another medication to try.

## 2017-09-16 ENCOUNTER — Telehealth: Payer: Self-pay | Admitting: *Deleted

## 2017-09-16 NOTE — Telephone Encounter (Signed)
Pt wants to know if you would be able to write her a note to permanently excuse her from jury duty due to not being able to sit for extended periods of time from lumbar DDD.  Please advise.

## 2017-09-16 NOTE — Telephone Encounter (Signed)
Ok for note 

## 2017-09-19 ENCOUNTER — Ambulatory Visit (INDEPENDENT_AMBULATORY_CARE_PROVIDER_SITE_OTHER): Payer: BLUE CROSS/BLUE SHIELD | Admitting: Physician Assistant

## 2017-09-19 ENCOUNTER — Encounter: Payer: Self-pay | Admitting: Physician Assistant

## 2017-09-19 ENCOUNTER — Encounter: Payer: Self-pay | Admitting: *Deleted

## 2017-09-19 VITALS — BP 125/56 | HR 67 | Ht 63.0 in | Wt 119.0 lb

## 2017-09-19 DIAGNOSIS — W57XXXA Bitten or stung by nonvenomous insect and other nonvenomous arthropods, initial encounter: Secondary | ICD-10-CM | POA: Diagnosis not present

## 2017-09-19 DIAGNOSIS — S30860A Insect bite (nonvenomous) of lower back and pelvis, initial encounter: Secondary | ICD-10-CM | POA: Diagnosis not present

## 2017-09-19 NOTE — Progress Notes (Signed)
   Subjective:    Patient ID: Debra Massey, female    DOB: 01-Sep-1970, 47 y.o.   MRN: [id number]  HPI Pt is a 47 yo female who presents to the clinic to have tick head removed. She noticed last night and husband tried to remove with forceps. Body was pulled out but head left in. Comes in to have removed today. No fever, chills, body aches.   .. Active Ambulatory Problems    Diagnosis Date Noted  . Primary osteoarthritis of both hands 10/02/2012  . Lumbar degenerative disc disease 11/01/2012  . 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 Tinea pedis 01/07/2014  . Smoker 12/17/2014  . Generalized anxiety disorder 04/19/2016  . Drooping eyelid, right 05/03/2016  . Peripheral vision loss, right 05/03/2016  . Left shoulder pain 07/19/2016  . 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 Skin tear of left hand without complication 03/28/2017  . Preventive measure 03/28/2017  . Pap smear abnormality of cervix/human papillomavirus (HPV) positive 04/10/2017   Resolved Ambulatory Problems    Diagnosis Date Noted  . No Resolved Ambulatory Problems   No Additional Past Medical History      Review of Systems See HPI.     Objective:   Physical Exam  Constitutional: She is oriented to person, place, and time. She appears well-developed and well-nourished.  Cardiovascular: Normal rate.  Neurological: She is alert and oriented to person, place, and time.  Skin:  Small black head of tick found in left lower back.   Psychiatric: She has a normal mood and affect. Her behavior is normal.          Assessment & Plan:  Marland KitchenMarland KitchenDiagnoses and all orders for this visit:  Tick bite with subsequent removal of tick   Foreign Body Patient complains of foreign body in left lower back tick. . It was first noticed 1 day ago. Symptoms: irritation. Attempts at  home to remove it by pulling out with tweezers have failed. She presents here to have it removed.  The head was left and removed with small 10inch blade  incision and tooth forceps. Topical antibiotic ointment placed and covered with bandaid.   Watch for any fever, chills, body aches, rash.

## 2017-09-19 NOTE — Telephone Encounter (Signed)
Letter done

## 2017-09-19 NOTE — Patient Instructions (Signed)

## 2017-10-05 ENCOUNTER — Telehealth: Payer: Self-pay | Admitting: *Deleted

## 2017-10-05 MED ORDER — PREDNISONE 50 MG PO TABS: ORAL_TABLET | ORAL | 0 refills | Status: DC

## 2017-10-05 NOTE — Telephone Encounter (Signed)
Done, if celebrex not as effective we can try a different NSAID.  Sometimes we have to rotate them.

## 2017-10-05 NOTE — Telephone Encounter (Signed)
Pt called wondering if she could possibly get a 5 day burst of Prednisone for her wrist as well as left knee pain. [facility] in [place] is her preferred pharm.

## 2017-10-05 NOTE — Telephone Encounter (Signed)
Pt.notified

## 2017-11-06 ENCOUNTER — Encounter: Payer: Self-pay | Admitting: Physician Assistant

## 2017-11-08 ENCOUNTER — Ambulatory Visit: Payer: BLUE CROSS/BLUE SHIELD | Admitting: Physician Assistant

## 2017-11-21 ENCOUNTER — Encounter: Payer: Self-pay | Admitting: Physician Assistant

## 2017-11-21 ENCOUNTER — Ambulatory Visit: Payer: BLUE CROSS/BLUE SHIELD | Admitting: Physician Assistant

## 2017-11-21 ENCOUNTER — Ambulatory Visit (INDEPENDENT_AMBULATORY_CARE_PROVIDER_SITE_OTHER): Payer: BLUE CROSS/BLUE SHIELD | Admitting: Physician Assistant

## 2017-11-21 VITALS — BP 135/62 | HR 52 | Ht 62.99 in | Wt 125.0 lb

## 2017-11-21 DIAGNOSIS — M545 Low back pain, unspecified: Secondary | ICD-10-CM

## 2017-11-21 DIAGNOSIS — R109 Unspecified abdominal pain: Secondary | ICD-10-CM | POA: Diagnosis not present

## 2017-11-21 LAB — POCT URINALYSIS DIPSTICK
Bilirubin, UA: NEGATIVE
GLUCOSE UA: NEGATIVE
KETONES UA: NEGATIVE
NITRITE UA: NEGATIVE
PROTEIN UA: NEGATIVE
RBC UA: NEGATIVE
SPEC GRAV UA: 1.015 (ref 1.010–1.025)
Urobilinogen, UA: 0.2 E.U./dL
pH, UA: 7 (ref 5.0–8.0)

## 2017-11-21 MED ORDER — METHOCARBAMOL 500 MG PO TABS: 500.0000 mg | ORAL_TABLET | Freq: Three times a day (TID) | ORAL | 1 refills | Status: DC

## 2017-11-21 NOTE — Progress Notes (Signed)
Subjective:    Patient ID: Debra Massey, female    DOB: [birth year], 47 y.o.   MRN: [id number]  HPI Patient is a 47 year old female who presents to the clinic with intermittent left flank/low back pain.  She denies any increase in urinary frequency or dysuria.  She denies any fever, chills, shortness of breath, nausea or vomiting.  She would like to rule out a urinary tract infection today.  For the last 3 to 4 weeks she has had intermittent dull to sharp pain just below her mid back.  She does admit she has been watching her 38-year-old grandson who is almost 30 pounds.  She does feel like on the day she watches him that she notices the pain.  At times the pain will wake her up at night.  She has a very old prescription of Robaxin from 2017.  She does take Celebrex as needed.  She admits that this seems to help some.  She does have tramadol for as needed as well.  She denies any radiation into her legs or buttocks.  She denies any strength changes in her lower extremities.  .. Active Ambulatory Problems    Diagnosis Date Noted  . Primary osteoarthritis of both hands 10/02/2012  . Lumbar degenerative disc disease 11/01/2012  . 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 Tinea pedis 01/07/2014  . Smoker 12/17/2014  . Generalized anxiety disorder 04/19/2016  . Drooping eyelid, right 05/03/2016  . Peripheral vision loss, right 05/03/2016  . Left shoulder pain 07/19/2016  . 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 Skin tear of left hand without complication 03/28/2017  . Preventive measure 03/28/2017  . Pap smear abnormality of cervix/human papillomavirus (HPV) positive 04/10/2017   Resolved Ambulatory Problems    Diagnosis Date Noted  . No Resolved Ambulatory Problems   No Additional Past Medical History      Review of Systems See HPI.     Objective:   Physical Exam  Constitutional: She is oriented to person, place, and time. She appears well-developed and well-nourished.  HENT:  Head:  Normocephalic and atraumatic.  Cardiovascular: Normal rate and regular rhythm.  Pulmonary/Chest: Effort normal and breath sounds normal.  No CVA tenderness.   Abdominal: Soft. Bowel sounds are normal. She exhibits no distension and no mass. There is no tenderness. There is no rebound and no guarding.  Musculoskeletal: Normal range of motion.  NROM at waist.  No tenderness over palpation of lumbar spine.  Seems to be some tight paraspinous muscles of lumbar spine to the left.   Neurological: She is alert and oriented to person, place, and time.  Psychiatric: She has a normal mood and affect. Her behavior is normal.          Assessment & Plan:  Marland KitchenMarland KitchenDiagnoses and all orders for this visit:  Acute left-sided low back pain without sciatica -     POCT Urinalysis Dipstick -     Urine Culture -     methocarbamol (ROBAXIN) 500 MG tablet; Take 1 tablet (500 mg total) by mouth 3 (three) times daily.    Results for orders placed or performed in visit on 11/21/17  POCT Urinalysis Dipstick  Result Value Ref Range   Color, UA yellow    Clarity, UA clear    Glucose, UA Negative Negative   Bilirubin, UA negative    Ketones, UA negative    Spec Grav, UA 1.015 1.010 - 1.025   Blood, UA negative    pH, UA 7.0 5.0 -  8.0   Protein, UA Negative Negative   Urobilinogen, UA 0.2 0.2 or 1.0 E.U./dL   Nitrite, UA negative    Leukocytes, UA Small (1+) (A) Negative   Appearance     Odor     Will culture. I do not think her symptoms represent a urinary tract infection today.  We will treat if culture comes back positive.  I do think her symptoms are consistent with musculoskeletal pathology.  Perhaps there could be some muscle strain with lifting her heavy grandson.  Discussed with patient to use her Pennsaid gel that she has at home.  Encouraged heat as needed.  She could also consider Biofreeze as needed.  Discussed certain stretches to help her low back.  I even think a massage would be a great  treatment for this.  I refilled her Robaxin to use as needed.  She can continue to use her Celebrex and for moderate to severe pain tramadol.  If the symptoms persist or worsen we could even consider physical therapy for dry needling.  If she is interested in getting a TENS unit but this is also something she could consider.

## 2017-11-22 ENCOUNTER — Encounter: Payer: Self-pay | Admitting: Physician Assistant

## 2017-11-23 LAB — URINE CULTURE
MICRO NUMBER: 90806343
SPECIMEN QUALITY: ADEQUATE

## 2017-11-23 NOTE — Progress Notes (Signed)
Normal flora detected on urine culture. No infection.

## 2018-01-24 ENCOUNTER — Ambulatory Visit (INDEPENDENT_AMBULATORY_CARE_PROVIDER_SITE_OTHER): Payer: BLUE CROSS/BLUE SHIELD | Admitting: Physician Assistant

## 2018-01-24 ENCOUNTER — Encounter: Payer: Self-pay | Admitting: Physician Assistant

## 2018-01-24 VITALS — BP 123/83 | HR 85 | Ht 62.99 in | Wt 118.0 lb

## 2018-01-24 DIAGNOSIS — R591 Generalized enlarged lymph nodes: Secondary | ICD-10-CM | POA: Diagnosis not present

## 2018-01-24 DIAGNOSIS — J3489 Other specified disorders of nose and nasal sinuses: Secondary | ICD-10-CM | POA: Diagnosis not present

## 2018-01-24 MED ORDER — PREDNISONE 50 MG PO TABS: ORAL_TABLET | ORAL | 0 refills | Status: DC

## 2018-01-24 NOTE — Progress Notes (Signed)
Subjective:    Patient ID: Debra Massey, female    DOB: Sep 18, 1970, 47 y.o.   MRN: [id number]  HPI  Patient is a 47 year old female who presents to the clinic with right-sided lymph node that has been persistently enlarged for the last month.  Many years ago she did have some lymph node swelling and even had a biopsy of one lymph node in the breast which was all benign.  She has been monitoring this right-sided lymph node for the last month.  It does not appear to be increasing in size.  It is nontender to touch..  It is not until recently that she has had some nasal congestion and runny nose.  She denies any sinus pressure, ear pain, sore throat, cough.  She does have some night sweats that is been ongoing since she has been slowly going through menopause.  She denies any fever.  She has started vaping more than smoking.  She may only smokes 1 cigarette a day but she is vaping a few times a day.  She feels like she is coughing less and less inflamed. She has had some weight loss but back to her normal weight.   .. Active Ambulatory Problems    Diagnosis Date Noted  . Primary osteoarthritis of both hands 10/02/2012  . Lumbar degenerative disc disease 11/01/2012  . 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 Tinea pedis 01/07/2014  . Smoker 12/17/2014  . Generalized anxiety disorder 04/19/2016  . Drooping eyelid, right 05/03/2016  . Peripheral vision loss, right 05/03/2016  . Left shoulder pain 07/19/2016  . 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 Skin tear of left hand without complication 03/28/2017  . Preventive measure 03/28/2017  . Pap smear abnormality of cervix/human papillomavirus (HPV) positive 04/10/2017  . Rhinorrhea 01/24/2018   Resolved Ambulatory Problems    Diagnosis Date Noted  . No Resolved Ambulatory Problems   No Additional Past Medical History      Review of Systems  All other systems reviewed and are negative.      Objective:   Physical Exam  Constitutional: She  is oriented to person, place, and time. She appears well-developed and well-nourished.  HENT:  Head: Normocephalic and atraumatic.  Right Ear: External ear normal.  Left Ear: External ear normal.  Mouth/Throat: Oropharynx is clear and moist. No oropharyngeal exudate.  TM's clear bilaterally.  Negative for any sinus tenderness.  Bilateral nasal turbinates are boggy and rhinorrhea present.   Eyes: Pupils are equal, round, and reactive to light. Conjunctivae and EOM are normal. Right eye exhibits no discharge. Left eye exhibits no discharge.  Neck: Normal range of motion. Neck supple. No thyromegaly present.  1cm by 1 and 1/2 cm mobile/non tender lymph node in the anterior cervical right side of neck.  No other lymphadenopathy palpated.   Cardiovascular: Normal rate and regular rhythm.  Pulmonary/Chest: Effort normal and breath sounds normal.  Lymphadenopathy:    She has cervical adenopathy.  Neurological: She is alert and oriented to person, place, and time.  Psychiatric: She has a normal mood and affect. Her behavior is normal.          Assessment & Plan:  Marland KitchenMarland KitchenDiagnoses and all orders for this visit:  Lymphadenopathy -     predniSONE (DELTASONE) 50 MG tablet; Take one tablet for 5 days.  Rhinorrhea -     predniSONE (DELTASONE) 50 MG tablet; Take one tablet for 5 days.   Reassured patient that lymph nodes seem to be non-concerning and  likely reactive.  Certainly this is been going on for a month so we need to monitor closely.  She is having some nasal congestion and rhinorrhea that is fairly new.  I do not see any signs of infection.  I will do a burst of prednisone for 5 days to cut down on the inflammation.  It is reassuring that patient does not have any fever, chills, new night sweats, significant weight loss.  If lymph node is enlarging or persistent we will get a CBC and a ultrasound of neck.  Follow-up in the next 4 weeks.

## 2018-02-01 ENCOUNTER — Telehealth: Payer: Self-pay | Admitting: *Deleted

## 2018-02-01 MED ORDER — AMOXICILLIN-POT CLAVULANATE 875-125 MG PO TABS: 1.0000 | ORAL_TABLET | Freq: Two times a day (BID) | ORAL | 0 refills | Status: DC

## 2018-02-01 NOTE — Telephone Encounter (Signed)
Sent augmentin

## 2018-02-01 NOTE — Telephone Encounter (Signed)
Pt notified of rx. 

## 2018-02-01 NOTE — Telephone Encounter (Signed)
Pt was seen recently for swollen lymph nodes.  She said she wasn't having any sinus symptoms at the time but she is now having sinus pressure, more on the right side of her face, and "ugly snot".  She has finished the prednisone you gave her but thinks she may need abx now.  [facility] [place].  Please advise.

## 2018-04-07 ENCOUNTER — Other Ambulatory Visit: Payer: Self-pay | Admitting: Physician Assistant

## 2018-04-07 MED ORDER — GLYCOPYRROLATE 1 MG PO TABS: 1.0000 mg | ORAL_TABLET | Freq: Three times a day (TID) | ORAL | 2 refills | Status: DC

## 2018-04-07 NOTE — Progress Notes (Signed)
Pt request medication for sweating excessively.

## 2018-06-01 ENCOUNTER — Telehealth: Payer: Self-pay | Admitting: Sports Medicine

## 2018-06-01 MED ORDER — SILVER SULFADIAZINE 1 % EX CREA: 1.0000 "application " | TOPICAL_CREAM | Freq: Every day | CUTANEOUS | 0 refills | Status: DC

## 2018-06-01 NOTE — Telephone Encounter (Signed)
Patient had a burn, adding Silvadene cream.

## 2018-06-13 ENCOUNTER — Encounter: Payer: Self-pay | Admitting: Sports Medicine

## 2018-06-26 ENCOUNTER — Other Ambulatory Visit (HOSPITAL_COMMUNITY)
Admission: RE | Admit: 2018-06-26 | Discharge: 2018-06-26 | Disposition: A | Discharge status: Home / Self Care | Payer: BLUE CROSS/BLUE SHIELD | Source: Ambulatory Visit | Attending: Physician Assistant | Admitting: Physician Assistant

## 2018-06-26 ENCOUNTER — Encounter: Payer: Self-pay | Admitting: Physician Assistant

## 2018-06-26 ENCOUNTER — Ambulatory Visit (INDEPENDENT_AMBULATORY_CARE_PROVIDER_SITE_OTHER): Payer: BLUE CROSS/BLUE SHIELD

## 2018-06-26 ENCOUNTER — Ambulatory Visit (INDEPENDENT_AMBULATORY_CARE_PROVIDER_SITE_OTHER): Payer: BLUE CROSS/BLUE SHIELD | Admitting: Physician Assistant

## 2018-06-26 VITALS — BP 124/54 | HR 60 | Temp 97.8°F | Wt 130.9 lb

## 2018-06-26 DIAGNOSIS — Z01411 Encounter for gynecological examination (general) (routine) with abnormal findings: Secondary | ICD-10-CM

## 2018-06-26 DIAGNOSIS — Z131 Encounter for screening for diabetes mellitus: Secondary | ICD-10-CM

## 2018-06-26 DIAGNOSIS — R8789 Other abnormal findings in specimens from female genital organs: Secondary | ICD-10-CM

## 2018-06-26 DIAGNOSIS — N888 Other specified noninflammatory disorders of cervix uteri: Secondary | ICD-10-CM | POA: Diagnosis not present

## 2018-06-26 DIAGNOSIS — Z13220 Encounter for screening for lipoid disorders: Secondary | ICD-10-CM | POA: Diagnosis not present

## 2018-06-26 DIAGNOSIS — R87618 Other abnormal cytological findings on specimens from cervix uteri: Secondary | ICD-10-CM

## 2018-06-26 DIAGNOSIS — N950 Postmenopausal bleeding: Secondary | ICD-10-CM

## 2018-06-26 DIAGNOSIS — R87610 Atypical squamous cells of undetermined significance on cytologic smear of cervix (ASC-US): Secondary | ICD-10-CM

## 2018-06-26 DIAGNOSIS — Z1231 Encounter for screening mammogram for malignant neoplasm of breast: Secondary | ICD-10-CM

## 2018-06-26 NOTE — Progress Notes (Signed)
d 

## 2018-06-26 NOTE — Progress Notes (Signed)
Subjective:    Patient ID: Debra Massey, female    DOB: 1970-05-29, 48 y.o.   MRN: [id number]  HPI  Pt is a 48 yo female who presents to the clinic for annual pap smear. 2018 HPV on pap with normal cells. She noticed for that last few weeks she has had some brown blood tinged discharge. She had some intermittent right lower abdominal pain. Denies any pain with intercourse. No odor or itching. Same monogamous partner.   .. Active Ambulatory Problems    Diagnosis Date Noted  . Primary osteoarthritis of both hands 10/02/2012  . Lumbar degenerative disc disease 11/01/2012  . 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 Tinea pedis 01/07/2014  . Smoker 12/17/2014  . Generalized anxiety disorder 04/19/2016  . Drooping eyelid, right 05/03/2016  . Peripheral vision loss, right 05/03/2016  . Left shoulder pain 07/19/2016  . 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 Skin tear of left hand without complication 03/28/2017  . Preventive measure 03/28/2017  . Pap smear abnormality of cervix/human papillomavirus (HPV) positive 04/10/2017  . Rhinorrhea 01/24/2018  . Lymphadenopathy 01/24/2018   Resolved Ambulatory Problems    Diagnosis Date Noted  . No Resolved Ambulatory Problems   No Additional Past Medical History    Review of Systems  All other systems reviewed and are negative.      Objective:   Physical Exam Vitals signs reviewed.  Constitutional:      Appearance: Normal appearance.  Cardiovascular:     Rate and Rhythm: Normal rate.  Pulmonary:     Effort: Pulmonary effort is normal.  Abdominal:     General: Bowel sounds are normal. There is no distension.     Palpations: Abdomen is soft.     Tenderness: There is no abdominal tenderness.  Genitourinary:       Comments: No adnexal tenderness.  Musculoskeletal: Normal range of motion.  Neurological:     General: No focal deficit present.     Mental Status: She is alert and oriented to person, place, and time.  Psychiatric:          Mood and Affect: Mood normal.        Behavior: Behavior normal.           Assessment & Plan:  [name].[name].Debra Massey was seen today for vaginal discharge.  Diagnoses and all orders for this visit:  Post-menopausal bleeding -     FSH/LH -     US Pelvic Complete With Transvaginal -     CBC with Differential/Platelet  Encounter for gynecological examination with abnormal finding -     Cytology - PAP  Screening for lipid disorders -     Lipid Panel w/reflex Direct LDL  Screening for diabetes mellitus -     COMPLETE METABOLIC PANEL WITH GFR  Visit for screening mammogram -     MM 3D SCREEN BREAST BILATERAL  Pap smear abnormality of cervix/human papillomavirus (HPV) positive -     Cytology - PAP   .[name]. [facility] 06/26/2018 01/24/2018 04/04/2017 02/09/2016  Decreased Interest 0 0 0 0  Down, Depressed, Hopeless 1 0 0 0  PHQ - 2 Score 1 0 0 0  Altered sleeping 0 1 - 0  Tired, decreased energy 1 1 - 1  Change in appetite 0 0 - 1  Feeling bad or failure about yourself  0 0 - 0  Trouble concentrating 0 0 - 0  Moving slowly or fidgety/restless 0 0 - 0  Suicidal thoughts 0  0 - 0  PHQ-9 Score 2 2 - 2  Difficult doing work/chores Not difficult at all Not difficult at all - -    Discussed 150 minutes of exercise a week.  Encouraged vitamin D 1000 units and Calcium 1300mg  or 4 servings of dairy a day.  Pap done today.  Due to some light bleeding will get pelvic ultrasound. Could be due to the nabothian cyst at cervix.  Mammogram ordered.   Fasting labs ordered.   Pt is cutting back on smoking but she continues to smoke. Strongly encouraged cessation.

## 2018-06-27 DIAGNOSIS — Z780 Asymptomatic menopausal state: Secondary | ICD-10-CM | POA: Insufficient documentation

## 2018-06-27 DIAGNOSIS — N950 Postmenopausal bleeding: Secondary | ICD-10-CM | POA: Insufficient documentation

## 2018-06-27 DIAGNOSIS — N888 Other specified noninflammatory disorders of cervix uteri: Secondary | ICD-10-CM | POA: Insufficient documentation

## 2018-06-27 LAB — CBC WITH DIFFERENTIAL/PLATELET
Absolute Monocytes: 630 cells/uL (ref 200–950)
BASOS ABS: 30 {cells}/uL (ref 0–200)
Basophils Relative: 0.5 %
EOS ABS: 30 {cells}/uL (ref 15–500)
Eosinophils Relative: 0.5 %
HCT: 42 % (ref 35.0–45.0)
Hemoglobin: 14.1 g/dL (ref 11.7–15.5)
Lymphs Abs: 2340 cells/uL (ref 850–3900)
MCH: 28.6 pg (ref 27.0–33.0)
MCHC: 33.6 g/dL (ref 32.0–36.0)
MCV: 85.2 fL (ref 80.0–100.0)
MPV: 10.6 fL (ref 7.5–12.5)
Monocytes Relative: 10.5 %
NEUTROS PCT: 49.5 %
Neutro Abs: 2970 cells/uL (ref 1500–7800)
Platelets: 268 10*3/uL (ref 140–400)
RBC: 4.93 10*6/uL (ref 3.80–5.10)
RDW: 12.1 % (ref 11.0–15.0)
Total Lymphocyte: 39 %
WBC: 6 10*3/uL (ref 3.8–10.8)

## 2018-06-27 LAB — LIPID PANEL W/REFLEX DIRECT LDL
CHOL/HDL RATIO: 2.3 (calc) (ref <5.0)
CHOLESTEROL: 157 mg/dL (ref <200)
HDL: 67 mg/dL (ref >=50)
LDL Cholesterol (Calc): 73 mg/dL (calc)
Non-HDL Cholesterol (Calc): 90 mg/dL (calc) (ref <130)
TRIGLYCERIDES: 87 mg/dL (ref <150)

## 2018-06-27 LAB — COMPLETE METABOLIC PANEL WITH GFR
AG Ratio: 2 (calc) (ref 1.0–2.5)
ALT: 18 U/L (ref 6–29)
AST: 20 U/L (ref 10–35)
Albumin: 4.5 g/dL (ref 3.6–5.1)
Alkaline phosphatase (APISO): 50 U/L (ref 31–125)
BUN: 11 mg/dL (ref 7–25)
CHLORIDE: 106 mmol/L (ref 98–110)
CO2: 26 mmol/L (ref 20–32)
Calcium: 9.6 mg/dL (ref 8.6–10.2)
Creat: 0.82 mg/dL (ref 0.50–1.10)
GFR, Est African American: 99 mL/min/{1.73_m2} (ref >=60)
GFR, Est Non African American: 86 mL/min/{1.73_m2} (ref >=60)
Globulin: 2.3 g/dL (calc) (ref 1.9–3.7)
Glucose, Bld: 92 mg/dL (ref 65–99)
Potassium: 4.8 mmol/L (ref 3.5–5.3)
Sodium: 140 mmol/L (ref 135–146)
Total Bilirubin: 0.3 mg/dL (ref 0.2–1.2)
Total Protein: 6.8 g/dL (ref 6.1–8.1)

## 2018-06-27 LAB — FSH/LH
FSH: 86.9 m[IU]/mL
LH: 45.9 m[IU]/mL

## 2018-06-27 NOTE — Progress Notes (Signed)
Call pt: the growths seen in cervix were seen also on u/s and appear like benign nabothian cysts. Endometrial thickness is 22mm. All reassuring. Still waiting pap results. If bleeding persisted we could try some progesterone but does not appear to be anything worrisome at this point.

## 2018-06-27 NOTE — Progress Notes (Signed)
Call pt: confirmed menopause. Cholesterol looks fantastic. Kidney, liver, glucose look good.

## 2018-06-28 LAB — CYTOLOGY - PAP
Bacterial vaginitis: NEGATIVE
Candida vaginitis: NEGATIVE
Diagnosis: UNDETERMINED — AB
HPV (WINDOPATH): NOT DETECTED

## 2018-06-29 DIAGNOSIS — R87610 Atypical squamous cells of undetermined significance on cytologic smear of cervix (ASC-US): Secondary | ICD-10-CM | POA: Insufficient documentation

## 2018-06-29 NOTE — Progress Notes (Signed)
Discussed results with patient. Last year HPV positive. Due to 2 types of atypical cells would like GYN to manage. Pt agrees.

## 2018-06-29 NOTE — Addendum Note (Signed)
Addended by: Jomarie Longs on: 06/29/2018 01:04 PM   Modules accepted: Orders

## 2018-06-30 ENCOUNTER — Encounter: Payer: Self-pay | Admitting: Physician Assistant

## 2018-07-05 ENCOUNTER — Ambulatory Visit (INDEPENDENT_AMBULATORY_CARE_PROVIDER_SITE_OTHER): Payer: BLUE CROSS/BLUE SHIELD

## 2018-07-05 DIAGNOSIS — Z1231 Encounter for screening mammogram for malignant neoplasm of breast: Secondary | ICD-10-CM

## 2018-07-07 NOTE — Progress Notes (Signed)
Call pt: normal mammogram. Follow up in 1 year.

## 2018-07-10 ENCOUNTER — Encounter: Payer: Self-pay | Admitting: Obstetrics & Gynecology

## 2018-07-10 ENCOUNTER — Ambulatory Visit (INDEPENDENT_AMBULATORY_CARE_PROVIDER_SITE_OTHER): Payer: BLUE CROSS/BLUE SHIELD | Admitting: Obstetrics & Gynecology

## 2018-07-10 VITALS — BP 124/72 | HR 59 | Resp 16 | Ht 63.0 in | Wt 131.0 lb

## 2018-07-10 DIAGNOSIS — R87610 Atypical squamous cells of undetermined significance on cytologic smear of cervix (ASC-US): Secondary | ICD-10-CM | POA: Diagnosis not present

## 2018-07-10 DIAGNOSIS — R87619 Unspecified abnormal cytological findings in specimens from cervix uteri: Secondary | ICD-10-CM | POA: Diagnosis not present

## 2018-07-10 DIAGNOSIS — N841 Polyp of cervix uteri: Secondary | ICD-10-CM | POA: Diagnosis not present

## 2018-07-10 DIAGNOSIS — R87810 Cervical high risk human papillomavirus (HPV) DNA test positive: Secondary | ICD-10-CM | POA: Diagnosis not present

## 2018-07-10 NOTE — Progress Notes (Signed)
   Subjective:    Patient ID: Debra Massey, female    DOB: 16-Dec-1970, 48 y.o.   MRN: [id number]  HPI 48 yo married P2 here for Colpo and embx due to a pap last week showing ASCUS + HR HPV and AGUS. She reports a normal pap last year with HR HPV.    Review of Systems She has been menopausal for about 2 years.    Objective:   Physical Exam Breathing, conversing, and ambulating normally Well nourished, well hydrated White female, no apparent distress Consent signed, time out done Cervix prepped with acetic acid. Transformation zone seen in its entirety. Colpo adequate. She had an area of mildly white after acetic acid at the 12 o'clock position. It measured about 1x1 cm I biopsied this area. Silver nitrate achieved hemostasis. ECC obtained.    Cervix prepped with betadine and grasped with a single tooth tenaculum Uterus sounded to 7 cm Pipelle used for 2 passes with a small amount of tissue obtained. She tolerated the procedure well.      Assessment & Plan:  ASCUS and AGUS pap - await pathology

## 2018-07-14 ENCOUNTER — Encounter: Payer: Self-pay | Admitting: Physician Assistant

## 2018-07-14 MED ORDER — DIAZEPAM 2 MG PO TABS: 2.0000 mg | ORAL_TABLET | Freq: Three times a day (TID) | ORAL | 0 refills | Status: DC | PRN

## 2018-07-19 ENCOUNTER — Ambulatory Visit (INDEPENDENT_AMBULATORY_CARE_PROVIDER_SITE_OTHER): Payer: BLUE CROSS/BLUE SHIELD | Admitting: Obstetrics & Gynecology

## 2018-07-19 ENCOUNTER — Other Ambulatory Visit (HOSPITAL_COMMUNITY)
Admission: RE | Admit: 2018-07-19 | Discharge: 2018-07-19 | Disposition: A | Discharge status: Home / Self Care | Payer: BLUE CROSS/BLUE SHIELD | Source: Ambulatory Visit | Attending: Obstetrics & Gynecology | Admitting: Obstetrics & Gynecology

## 2018-07-19 ENCOUNTER — Encounter: Payer: Self-pay | Admitting: Obstetrics & Gynecology

## 2018-07-19 VITALS — BP 147/88 | HR 82 | Wt 130.7 lb

## 2018-07-19 DIAGNOSIS — R87619 Unspecified abnormal cytological findings in specimens from cervix uteri: Secondary | ICD-10-CM

## 2018-07-19 DIAGNOSIS — N870 Mild cervical dysplasia: Secondary | ICD-10-CM | POA: Diagnosis not present

## 2018-07-19 DIAGNOSIS — Z23 Encounter for immunization: Secondary | ICD-10-CM

## 2018-07-19 NOTE — Addendum Note (Signed)
Addended by: Nicholaus Bloom C on: 07/19/2018 09:00 AM   Modules accepted: Orders

## 2018-07-19 NOTE — Progress Notes (Addendum)
   Subjective:    Patient ID: Debra Massey, female    DOB: [birth year], 48 y.o.   MRN: [id number]  HPI 48 yo married P2 here for a LEEP due to a cervical biopsy due to "cannot rule out malignancy".  Her pap showed ASCUS negative HR HPV and AGUS.   Review of Systems She has been menopausal for about 2 years.    Objective:   Physical Exam Breathing, conversing, and ambulating normally Well nourished, well hydrated White female, no apparent distress  Colpo Biopsy:   Risks, benefits, alternatives, and limitations of procedure explained to patient, including pain, bleeding, infection, failure to remove abnormal tissue and failure to cure dysplasia, need for repeat procedures, damage to pelvic organs, cervical incompetence.  Role of HPV,cervical dysplasia and need for close followup was empasized. Informed written consent was obtained. All questions were answered. Time out performed. Urine pregnancy test was negative.  Procedure: The patient was placed in lithotomy position and the bivalved coated speculum was placed in the patient's vagina. A grounding pad placed on the patient. Acetic acid was applied to the cervix and areas of decreased uptake were noted around the transformation zone.   Local anesthesia was administered via an intracervical block using 15cc of 2% Lidocaine with epinephrine. The suction was turned on and the large 1X Fisher Cone Biopsy Excisor on 50 Watts of cutting current was used to excise the entire transformation zone and any areas of visible dysplasia. I obtained an ECC.  Excellent hemostasis was achieved using roller ball coagulation set at 50 Watts coagulation current. The speculum was removed from the vagina. Specimens were sent to pathology.  The patient tolerated the procedure well. Post-operative instructions given to patient, including instruction to seek medical attention for persistent bright red bleeding, fever, abdominal/pelvic pain, dysuria, nausea or  vomiting. She was also told about the possibility of having copious yellow to black tinged discharge for weeks. She was counseled to avoid anything in the vagina (sex/douching/tampons) for 3 weeks. She has a 4 week post-operative check to assess wound healing, review results and discuss further management.      Assessment & Plan:  Rule out cancer- await pathology Debra Massey will call her with pathology. Flu vaccine today

## 2018-07-26 ENCOUNTER — Telehealth: Payer: Self-pay

## 2018-07-26 NOTE — Telephone Encounter (Addendum)
-----   Message from Adam Phenix, MD sent at 07/26/2018  8:52 AM EDT ----- LSIL, f/u pap 12 months  Notified pt of her results and f/u.  Pt stated understanding with no further questions.

## 2018-08-11 ENCOUNTER — Encounter: Payer: Self-pay | Admitting: *Deleted

## 2018-08-21 ENCOUNTER — Telehealth: Payer: Self-pay | Admitting: Physician Assistant

## 2018-08-21 NOTE — Telephone Encounter (Signed)
Patient responded and stated that she could not afford the patches and did not want them.

## 2018-08-21 NOTE — Telephone Encounter (Signed)
MyChart message sent to patient. Waiting on a response.

## 2018-08-21 NOTE — Telephone Encounter (Signed)
I received a notification that a PA was needed for Qbrexa and I do not see a current prescription on file. Please advise.

## 2018-08-21 NOTE — Telephone Encounter (Signed)
Call pt and confirm she is using this? I don't think she ever used it.

## 2018-08-21 NOTE — Telephone Encounter (Signed)
Received fax from [employer] that Qbrexa requires a PA. Information has been sent to the insurance company. Awaiting determination.

## 2018-10-04 ENCOUNTER — Encounter: Payer: Self-pay | Admitting: Physician Assistant

## 2018-10-04 NOTE — Telephone Encounter (Signed)
Please schedule

## 2018-10-06 ENCOUNTER — Encounter: Payer: Self-pay | Admitting: Physician Assistant

## 2018-10-06 ENCOUNTER — Ambulatory Visit (INDEPENDENT_AMBULATORY_CARE_PROVIDER_SITE_OTHER): Payer: BLUE CROSS/BLUE SHIELD | Admitting: Physician Assistant

## 2018-10-06 VITALS — Temp 98.1°F | Wt 137.5 lb

## 2018-10-06 DIAGNOSIS — F411 Generalized anxiety disorder: Secondary | ICD-10-CM | POA: Diagnosis not present

## 2018-10-06 DIAGNOSIS — F410 Panic disorder [episodic paroxysmal anxiety] without agoraphobia: Secondary | ICD-10-CM

## 2018-10-06 MED ORDER — DIAZEPAM 2 MG PO TABS: 2.0000 mg | ORAL_TABLET | Freq: Two times a day (BID) | ORAL | 1 refills | Status: DC | PRN

## 2018-10-06 NOTE — Progress Notes (Signed)
Patient ID: Debra Massey, female   DOB: 1971-04-30, 48 y.o.   MRN: [id number] .Marland KitchenVirtual Visit via Video Note  I connected with Debra Massey on 10/06/18 at  8:30 AM EDT by a video enabled telemedicine application and verified that I am speaking with the correct person using two identifiers.  Location: Patient: home Provider: clinic   I discussed the limitations of evaluation and management by telemedicine and the availability of in person appointments. The patient expressed understanding and agreed to proceed.  History of Present Illness: Pt is a 48 yo female with GAD who calls into the clinic to discuss recent panic attacks. She will have times where she feels SOB and chest tightness and worry. She had valium left over from OB procedure and got a lot of relief. She has had 4-5 panic attacks this month. She was controlled with anxiety. She feels like cOVID has induced this. 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 She wants to have valium to use as needed.   .. Active Ambulatory Problems    Diagnosis Date Noted  . Primary osteoarthritis of both hands 10/02/2012  . Lumbar degenerative disc disease 11/01/2012  . 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 Tinea pedis 01/07/2014  . Smoker 12/17/2014  . Generalized anxiety disorder 04/19/2016  . Drooping eyelid, right 05/03/2016  . Peripheral vision loss, right 05/03/2016  . Left shoulder pain 07/19/2016  . 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 Skin tear of left hand without complication 03/28/2017  . Preventive measure 03/28/2017  . Pap smear abnormality of cervix/human papillomavirus (HPV) positive 04/10/2017  . Rhinorrhea 01/24/2018  . Lymphadenopathy 01/24/2018  . Nabothian cyst 06/27/2018  . Post-menopausal bleeding 06/27/2018  . Atypical squamous cell changes of undetermined significance (ASCUS) on cervical cytology with negative high risk human papilloma virus (HPV) test result 06/29/2018  . Panic attacks 10/06/2018    Resolved Ambulatory Problems    Diagnosis Date Noted  . No Resolved Ambulatory Problems   Past Medical History:  Diagnosis Date  . Arthritis    Reviewed med, allergy, problem list.     Observations/Objective: No acute distress. Normal appearance.  Normal breathing.  .. Today's Vitals   10/06/18 0814  Temp: 98.1 F (36.7 C)  TempSrc: Oral  Weight: 137 lb 8 oz (62.4 kg)   Body mass index is 24.36 kg/m. .. Depression screen [facility] 2/9 10/06/2018 07/19/2018 06/26/2018 01/24/2018 04/04/2017  Decreased Interest 0 0 0 0 0  Down, Depressed, Hopeless 0 0 1 0 0  PHQ - 2 Score 0 0 1 0 0  Altered sleeping - 0 0 1 -  Tired, decreased energy - 1 1 1  -  Change in appetite - 0 0 0 -  Feeling bad or failure about yourself  - 1 0 0 -  Trouble concentrating - 0 0 0 -  Moving slowly or fidgety/restless - 0 0 0 -  Suicidal thoughts - 0 0 0 -  PHQ-9 Score - 2 2 2  -  Difficult doing work/chores - - Not difficult at all Not difficult at all -   .. GAD 7 : Generalized Anxiety Score 10/06/2018 07/19/2018 06/26/2018 01/24/2018  Nervous, Anxious, on Edge 2 1 1 1   Control/stop worrying 1 0 0 0  Worry too much - different things 2 1 1 1   Trouble relaxing 2 0 0 0  Restless 1 1 0 0  Easily annoyed or irritable 2 0 1 2  Afraid - awful might happen 0 -  1 0  Total GAD 7 Score 10 - 4 4  Anxiety Difficulty Not difficult at all - Not difficult at all Not difficult at all      Assessment and Plan: [name].[name].Debra ArnoldStacy was seen today for anxiety.  Diagnoses and all orders for this visit:  Panic attacks -     diazepam (VALIUM) 2 MG tablet; Take 1 tablet (2 mg total) by mouth every 12 (twelve) hours as needed for anxiety.  Generalized anxiety disorder   Anxiety is worse likely due to COVID pandemic. She is going back to work soon and has some anxiety surrounding that. Will given valium to use as needed for panic attacks. Discussed dependency risk. If finding needing daily. Follow up and consider daily anxiety  medication. Discussed mediation and exercise. Follow up as needed.    Follow Up Instructions:    I discussed the assessment and treatment plan with the patient. The patient was provided an opportunity to ask questions and all were answered. The patient agreed with the plan and demonstrated an understanding of the instructions.   The patient was advised to call back or seek an in-person evaluation if the symptoms worsen or if the condition fails to improve as anticipated.     Debra GawJade Breeback, PA-C

## 2018-12-26 ENCOUNTER — Telehealth: Payer: Self-pay | Admitting: *Deleted

## 2018-12-26 NOTE — Telephone Encounter (Signed)
Left patient a message to call and schedule [facility] appointment with a MD for post menopausal issues of low energy per [facility].

## 2019-01-02 ENCOUNTER — Encounter: Payer: Self-pay | Admitting: Physician Assistant

## 2019-01-03 ENCOUNTER — Ambulatory Visit (INDEPENDENT_AMBULATORY_CARE_PROVIDER_SITE_OTHER): Payer: BC Managed Care – PPO | Admitting: Physician Assistant

## 2019-01-03 ENCOUNTER — Other Ambulatory Visit: Payer: Self-pay

## 2019-01-03 ENCOUNTER — Encounter: Payer: Self-pay | Admitting: Physician Assistant

## 2019-01-03 VITALS — Ht 63.0 in | Wt 134.0 lb

## 2019-01-03 DIAGNOSIS — R5383 Other fatigue: Secondary | ICD-10-CM | POA: Diagnosis not present

## 2019-01-03 DIAGNOSIS — R413 Other amnesia: Secondary | ICD-10-CM

## 2019-01-03 DIAGNOSIS — R41840 Attention and concentration deficit: Secondary | ICD-10-CM | POA: Diagnosis not present

## 2019-01-03 MED ORDER — BUPROPION HCL ER (XL) 150 MG PO TB24: 150.0000 mg | ORAL_TABLET | ORAL | 1 refills | Status: DC

## 2019-01-03 NOTE — Progress Notes (Signed)
Patient ID: Debra Massey, female   DOB: November 23, 1970, 48 y.o.   MRN: [id number] .Marland KitchenVirtual Visit via Video Note  I connected with Debra Massey on 01/03/19 at  9:10 AM EDT by a video enabled telemedicine application and verified that I am speaking with the correct person using two identifiers.  Location: Patient: in car Provider: clinic   I discussed the limitations of evaluation and management by telemedicine and the availability of in person appointments. The patient expressed understanding and agreed to proceed.  History of Present Illness: Pt is a 48 yo female who calls into the clinic to discuss attention/memory/focus. She has been noticing for the last 6 months that her attention lacks. She is starting jobs but not finishing them. She is not listening to people when they talk. She feels like worsening since start to go through menopause. She denies any med changes. She has not been exercising as much due to gyms being closed with COVID. She denies any anxiety or depression. She admits a friend giving her adderall to try. She took it and "had the best day she has had in a long time". She felt like she could accomplish task and stay focused.   .. Active Ambulatory Problems    Diagnosis Date Noted  . Primary osteoarthritis of both hands 10/02/2012  . Lumbar degenerative disc disease 11/01/2012  . 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 Tinea pedis 01/07/2014  . Smoker 12/17/2014  . Generalized anxiety disorder 04/19/2016  . Drooping eyelid, right 05/03/2016  . Peripheral vision loss, right 05/03/2016  . Left shoulder pain 07/19/2016  . 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 Skin tear of left hand without complication [id number]/23/[id number]  . Preventive measure 03/28/2017  . Pap smear abnormality of cervix/human papillomavirus (HPV) positive 04/10/2017  . Rhinorrhea 01/24/2018  . Lymphadenopathy 01/24/2018  . Nabothian cyst 06/27/2018  . Post-menopausal bleeding 06/27/2018  . Atypical squamous cell  changes of undetermined significance (ASCUS) on cervical cytology with negative high risk human papilloma virus (HPV) test result 06/29/2018  . Panic attacks 10/06/2018  . No energy 01/05/2019  . Memory changes 01/05/2019  . Inattention 01/05/2019   Resolved Ambulatory Problems    Diagnosis Date Noted  . No Resolved Ambulatory Problems   Past Medical History:  Diagnosis Date  . Arthritis    Reviewed med, allergy, problem list.   Observations/Objective: No acute distress No labored breathing.  Normal appearance and mood.  .. Today's Vitals   01/03/19 0853  Weight: 134 lb (60.8 kg)  Height: 5\' 3"  (1.6 m)   Body mass index is 23.74 kg/m.  ..   Adult ADHD Self Report Scale (most recent)    Adult ADHD Self-Report Scale (ASRS-v1.1) Symptom Checklist - 01/03/19 0900      Part A   1. How often do you have trouble wrapping up the final details of a project, once the challenging parts have been done?  (!) Very Often  2. How often do you have difficulty getting things done in order when you have to do a task that requires organization?  (!) Very Often    3. How often do you have problems remembering appointments or obligations?  (!) Often  4. When you have a task that requires a lot of thought, how often do you avoid or delay getting started?  Sometimes    5. How often do you fidget or squirm with your hands or feet when you have to sit down for a long time?  Sometimes  6. How often do you feel overly active and compelled to do things, like you were driven by a motor?  Sometimes      Part B   7. How often do you make careless mistakes when you have to work on a boring or difficult project?  Sometimes  8. How often do you have difficulty keeping your attention when you are doing boring or repetitive work?  (!) Very Often    9. How often do you have difficulty concentrating on what people say to you, even when they are speaking to you directly?  (!) Very Often  10. How often do you  misplace or have difficulty finding things at home or at work?  Sometimes    11. How often are you distracted by activity or noise around you?  (!) Often  12. How often do you leave your seat in meetings or other situations in which you are expected to remain seated?  Rarely    13. How often do you feel restless or fidgety?  (!) Often  14. How often do you have difficulty unwinding and relaxing when you have time to yourself?  Rarely    15. How often do you find yourself talking too much when you are in social situations?  Rarely  16. When you are in a conversation, how often do you find yourself finishing the sentences of the people you are talking to, before they can finish them themselves?  Rarely    17. How often do you have difficulty waiting your turn in situations when turn taking is required?  Rarely  18. How often do you interrupt others when they are busy?  Rarely       Assessment and Plan: [name].[name].Debra Massey was seen today for anxiety.  Diagnoses and all orders for this visit:  Inattention -     VITAMIN D 25 Hydroxy (Vit-D Deficiency, Fractures) -     B12 and Folate Panel -     TSH -     CBC with Differential/Platelet  No energy -     VITAMIN D 25 Hydroxy (Vit-D Deficiency, Fractures) -     B12 and Folate Panel -     TSH -     CBC with Differential/Platelet  Memory changes -     VITAMIN D 25 Hydroxy (Vit-D Deficiency, Fractures) -     B12 and Folate Panel -     TSH -     CBC with Differential/Platelet  Other orders -     Discontinue: buPROPion (WELLBUTRIN XL) 150 MG 24 hr tablet; Take 1 tablet (150 mg total) by mouth every morning.  .. Depression screen [facility] 2/9 01/03/2019 10/06/2018 07/19/2018 06/26/2018 01/24/2018  Decreased Interest 1 0 0 0 0  Down, Depressed, Hopeless 0 0 0 1 0  PHQ - 2 Score 1 0 0 1 0  Altered sleeping 0 - 0 0 1  Tired, decreased energy 2 - 1 1 1   Change in appetite 0 - 0 0 0  Feeling bad or failure about yourself  0 - 1 0 0  Trouble concentrating 3 - 0 0 0   Moving slowly or fidgety/restless 0 - 0 0 0  Suicidal thoughts 0 - 0 0 0  PHQ-9 Score 6 - 2 2 2   Difficult doing work/chores Somewhat difficult - - Not difficult at all Not difficult at all   .[name]. GAD 7 : Generalized Anxiety Score 01/03/2019 10/06/2018 07/19/2018 06/26/2018  Nervous, Anxious, on Edge 1 2  1 1  Control/stop worrying 0 1 0 0  Worry too much - different things 0 2 1 1   Trouble relaxing 0 2 0 0  Restless 1 1 1  0  Easily annoyed or irritable 3 2 0 1  Afraid - awful might happen 0 0 - 1  Total GAD 7 Score 5 10 - 4  Anxiety Difficulty Not difficult at all Not difficult at all - Not difficult at all     ADHD screening was positive. I do not think anxiety or depression causing symptoms. will get some labs to look for vitamin causes.  She certainly could have had ADD/ADHD for her adult life and masked by exercise. She is not exercising right now.  Started wellbutrin but she called back and remembered taking it and she did not like it and does not want to try again. Low dose adderall given to start. Discussed side effects. Most concerned about weight loss. Her BMI is already low. Follow up in 1 month.   Follow Up Instructions:    I discussed the assessment and treatment plan with the patient. The patient was provided an opportunity to ask questions and all were answered. The patient agreed with the plan and demonstrated an understanding of the instructions.   The patient was advised to call back or seek an in-person evaluation if the symptoms worsen or if the condition fails to improve as anticipated.     Tandy GawJade Breeback, PA-C

## 2019-01-03 NOTE — Progress Notes (Signed)
Patient wants to discuss inattention. ADHD self report scale completed. PHQ9-GAD7 completed.

## 2019-01-04 MED ORDER — AMPHETAMINE-DEXTROAMPHET ER 10 MG PO CP24: 10.0000 mg | ORAL_CAPSULE | Freq: Every day | ORAL | 0 refills | Status: DC

## 2019-01-05 DIAGNOSIS — R41840 Attention and concentration deficit: Secondary | ICD-10-CM | POA: Insufficient documentation

## 2019-01-05 DIAGNOSIS — R5383 Other fatigue: Secondary | ICD-10-CM | POA: Insufficient documentation

## 2019-01-05 DIAGNOSIS — R413 Other amnesia: Secondary | ICD-10-CM | POA: Insufficient documentation

## 2019-02-13 ENCOUNTER — Ambulatory Visit (INDEPENDENT_AMBULATORY_CARE_PROVIDER_SITE_OTHER): Payer: BC Managed Care – PPO | Admitting: Physician Assistant

## 2019-02-13 ENCOUNTER — Other Ambulatory Visit: Payer: Self-pay

## 2019-02-13 ENCOUNTER — Encounter: Payer: Self-pay | Admitting: Physician Assistant

## 2019-02-13 VITALS — BP 128/78 | HR 62 | Ht 63.5 in | Wt 130.0 lb

## 2019-02-13 DIAGNOSIS — Z23 Encounter for immunization: Secondary | ICD-10-CM | POA: Diagnosis not present

## 2019-02-13 DIAGNOSIS — F411 Generalized anxiety disorder: Secondary | ICD-10-CM | POA: Diagnosis not present

## 2019-02-13 DIAGNOSIS — R454 Irritability and anger: Secondary | ICD-10-CM | POA: Insufficient documentation

## 2019-02-13 DIAGNOSIS — R41840 Attention and concentration deficit: Secondary | ICD-10-CM

## 2019-02-13 MED ORDER — AMPHETAMINE-DEXTROAMPHET ER 10 MG PO CP24: 10.0000 mg | ORAL_CAPSULE | Freq: Every day | ORAL | 0 refills | Status: DC

## 2019-02-13 MED ORDER — DESVENLAFAXINE SUCCINATE ER 50 MG PO TB24: ORAL_TABLET | ORAL | 1 refills | Status: DC

## 2019-02-13 NOTE — Progress Notes (Signed)
Subjective:    Patient ID: Debra Massey, female    DOB: [birth year], 48 y.o.   MRN: [id number]  HPI  Patient is a 48 year old female with inattention and problems focusing who presents to the clinic for follow-up.  We started low-dose Adderall at last visit.  Patient did really well.  She states this helps her significantly at work.  She denies any problems sleeping, chest pain, headaches, anxiety.  She has been more aware of her mood.  She feels like her anxiety and irritability was before Adderall but now she feels like she also needs treatment for this.  She was on Pristiq about 7 years ago and it helped her a lot.  She denies any suicidal thoughts or homicidal idealizations.  She is very motivated.  She denies any feelings of hopelessness or helplessness.  She really just feels more stressed and easily angered.  She would like to go back on Pristiq.  .. Active Ambulatory Problems    Diagnosis Date Noted  . Primary osteoarthritis of both hands 10/02/2012  . Lumbar degenerative disc disease 11/01/2012  . 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 Tinea pedis 01/07/2014  . Smoker 12/17/2014  . Generalized anxiety disorder 04/19/2016  . Drooping eyelid, right 05/03/2016  . Peripheral vision loss, right 05/03/2016  . Left shoulder pain 07/19/2016  . 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 Skin tear of left hand without complication 03/28/2017  . Preventive measure 03/28/2017  . Pap smear abnormality of cervix/human papillomavirus (HPV) positive 04/10/2017  . Rhinorrhea 01/24/2018  . Lymphadenopathy 01/24/2018  . Nabothian cyst 06/27/2018  . Post-menopausal bleeding 06/27/2018  . Atypical squamous cell changes of undetermined significance (ASCUS) on cervical cytology with negative high risk human papilloma virus (HPV) test result 06/29/2018  . Panic attacks 10/06/2018  . No energy 01/05/2019  . Memory changes 01/05/2019  . Inattention 01/05/2019   Resolved Ambulatory Problems    Diagnosis  Date Noted  . No Resolved Ambulatory Problems   Past Medical History:  Diagnosis Date  . Arthritis        Review of Systems  All other systems reviewed and are negative.      Objective:   Physical Exam Vitals signs reviewed.  Constitutional:      Appearance: Normal appearance.  HENT:     Head: Normocephalic.  Cardiovascular:     Rate and Rhythm: Normal rate.     Pulses: Normal pulses.  Pulmonary:     Effort: Pulmonary effort is normal.  Neurological:     General: No focal deficit present.     Mental Status: She is alert and oriented to person, place, and time.  Psychiatric:        Mood and Affect: Mood normal.       ..   Adult ADHD Self Report Scale (most recent)    Adult ADHD Self-Report Scale (ASRS-v1.1) Symptom Checklist - 02/13/19 0900      Part A   1. How often do you have trouble wrapping up the final details of a project, once the challenging parts have been done?  (!) Sometimes  2. How often do you have difficulty getting things done in order when you have to do a task that requires organization?  (!) Often    3. How often do you have problems remembering appointments or obligations?  (!) Often  4. When you have a task that requires a lot of thought, how often do you avoid or delay getting started?  (!) Often  5. How often do you fidget or squirm with your hands or feet when you have to sit down for a long time?  Sometimes  6. How often do you feel overly active and compelled to do things, like you were driven by a motor?  Sometimes      Part B   7. How often do you make careless mistakes when you have to work on a boring or difficult project?  Rarely  8. How often do you have difficulty keeping your attention when you are doing boring or repetitive work?  Sometimes    9. How often do you have difficulty concentrating on what people say to you, even when they are speaking to you directly?  (!) Sometimes  10. How often do you misplace or have difficulty  finding things at home or at work?  Sometimes    11. How often are you distracted by activity or noise around you?  (!) Often  12. How often do you leave your seat in meetings or other situations in which you are expected to remain seated?  (!) Sometimes    13. How often do you feel restless or fidgety?  Sometimes  14. How often do you have difficulty unwinding and relaxing when you have time to yourself?  Rarely    15. How often do you find yourself talking too much when you are in social situations?  Rarely  16. When you are in a conversation, how often do you find yourself finishing the sentences of the people you are talking to, before they can finish them themselves?  Rarely    17. How often do you have difficulty waiting your turn in situations when turn taking is required?  Rarely  18. How often do you interrupt others when they are busy?  Rarely      .[name]. Depression screen [facility] 02/13/2019 01/03/2019 10/06/2018 07/19/2018 06/26/2018  Decreased Interest 1 1 0 0 0  Down, Depressed, Hopeless 0 0 0 0 1  PHQ - 2 Score 1 1 0 0 1  Altered sleeping 0 0 - 0 0  Tired, decreased energy 1 2 - 1 1  Change in appetite 0 0 - 0 0  Feeling bad or failure about yourself  0 0 - 1 0  Trouble concentrating 2 3 - 0 0  Moving slowly or fidgety/restless 1 0 - 0 0  Suicidal thoughts 0 0 - 0 0  PHQ-9 Score 5 6 - 2 2  Difficult doing work/chores Somewhat difficult Somewhat difficult - - Not difficult at all   .[name]. GAD 7 : Generalized Anxiety Score 02/13/2019 01/03/2019 10/06/2018 07/19/2018  Nervous, Anxious, on Edge 1 1 2 1   Control/stop worrying 0 0 1 0  Worry too much - different things 0 0 2 1  Trouble relaxing 0 0 2 0  Restless 1 1 1 1   Easily annoyed or irritable 2 3 2  0  Afraid - awful might happen 0 0 0 -  Total GAD 7 Score 4 5 10  -  Anxiety Difficulty Somewhat difficult Not difficult at all Not difficult at all -        Assessment & Plan:  [name].[name].[name] was seen today for adhd.  Diagnoses and all orders  for this visit:  Inattention -     amphetamine-dextroamphetamine (ADDERALL XR) 10 MG 24 hr capsule; Take 1 capsule (10 mg total) by mouth daily. -     amphetamine-dextroamphetamine (ADDERALL XR) 10 MG 24 hr capsule; Take  1 capsule (10 mg total) by mouth daily. -     amphetamine-dextroamphetamine (ADDERALL XR) 10 MG 24 hr capsule; Take 1 capsule (10 mg total) by mouth daily.  Flu vaccine need -     Flu Vaccine QUAD 36+ mos IM  Irritability and anger -     desvenlafaxine (PRISTIQ) 50 MG 24 hr tablet; Take 1/2 tablet for 7 days then increase to one tablet daily.  GAD (generalized anxiety disorder) -     desvenlafaxine (PRISTIQ) 50 MG 24 hr tablet; Take 1/2 tablet for 7 days then increase to one tablet daily.   I would not necessarily to use prostate for anxiety and irritability however patient has used this in the past and had good results.  Went ahead and sent that to the pharmacy today.  Discussed other calming relaxation techniques she can use to help with her mood.  Patient is adamant that her irritability started before Adderall.  I did discuss that Adderall and stimulants in general can cause mood to worsen.  She does not feel like that is the case.  She has lost 4 pounds since starting medication.  She does not feel like her hunger has decreased that much but she is more active.  Her blood pressure was elevated upon arrival but she just smoked.  Went down on second recheck.  Follow-up in 3 months.

## 2019-03-10 ENCOUNTER — Encounter: Payer: Self-pay | Admitting: Physician Assistant

## 2019-04-23 ENCOUNTER — Encounter: Payer: Self-pay | Admitting: Obstetrics & Gynecology

## 2019-04-23 ENCOUNTER — Telehealth (INDEPENDENT_AMBULATORY_CARE_PROVIDER_SITE_OTHER): Payer: BC Managed Care – PPO | Admitting: Obstetrics & Gynecology

## 2019-04-23 DIAGNOSIS — N951 Menopausal and female climacteric states: Secondary | ICD-10-CM | POA: Diagnosis not present

## 2019-04-23 MED ORDER — ESTRADIOL 10 MCG VA TABS: ORAL_TABLET | VAGINAL | 12 refills | Status: DC

## 2019-04-23 NOTE — Progress Notes (Signed)
    TELEHEALTH GYNECOLOGY VIRTUAL VIDEO VISIT ENCOUNTER NOTE  Provider location: [facility] [place]   I connected with Debra Massey on 04/23/19 at  8:30 AM EST by MyChart Video Encounter at home and verified that I am speaking with the correct person using two identifiers.   I discussed the limitations, risks, security and privacy concerns of performing an evaluation and management service virtually and the availability of in person appointments. I also discussed with the patient that there may be a patient responsible charge related to this service. The patient expressed understanding and agreed to proceed.   History:  Debra Massey is a 48 y.o. G2P2 female being evaluated today for the issue of vaginal dryness. She has been menopausal for about 2 years. She has tried coconut oil, [place] over the counter. She has not used an prescription meds for this issue.      Past Medical History:  Diagnosis Date  . Arthritis    Past Surgical History:  Procedure Laterality Date  . BREAST BIOPSY    . breast biospy  2008   benign  . CHOLECYSTECTOMY     2010  . laprascopy     The following portions of the patient's history were reviewed and updated as appropriate: allergies, current medications, past family history, past medical history, past social history, past surgical history and problem list.    Review of Systems:  Pertinent items noted in HPI and remainder of comprehensive ROS otherwise negative.  Physical Exam:   General:  Alert, oriented and cooperative. Patient appears to be in no acute distress.  Mental Status: Normal mood and affect. Normal behavior. Normal judgment and thought content.   Respiratory: Normal respiratory effort, no problems with respiration noted  Rest of physical exam deferred due to type of encounter  Labs and Imaging No results found for this or any previous visit (from the past 336 hour(s)). No results found.     Assessment and Plan:      Vaginal dryness, post menopausal- rec vagifem, every night for 2 weeks, then 2 times per week Come back in 2 months if no better Will prescribe compounded vaginal estrogen if generic vagifem is too expensive.      I discussed the assessment and treatment plan with the patient. The patient was provided an opportunity to ask questions and all were answered. The patient agreed with the plan and demonstrated an understanding of the instructions.   The patient was advised to call back or seek an in-person evaluation/go to the ED if the symptoms worsen or if the condition fails to improve as anticipated.  I provided 10 minutes of face-to-face time during this encounter.   Debra Filbert, MD [facility], [place]

## 2019-05-23 ENCOUNTER — Encounter: Payer: Self-pay | Admitting: Physician Assistant

## 2019-06-18 ENCOUNTER — Ambulatory Visit (INDEPENDENT_AMBULATORY_CARE_PROVIDER_SITE_OTHER): Payer: BC Managed Care – PPO | Admitting: Physician Assistant

## 2019-06-18 ENCOUNTER — Ambulatory Visit (INDEPENDENT_AMBULATORY_CARE_PROVIDER_SITE_OTHER): Payer: Self-pay | Admitting: Sports Medicine

## 2019-06-18 ENCOUNTER — Encounter: Payer: Self-pay | Admitting: Sports Medicine

## 2019-06-18 ENCOUNTER — Encounter: Payer: Self-pay | Admitting: Physician Assistant

## 2019-06-18 ENCOUNTER — Other Ambulatory Visit: Payer: Self-pay

## 2019-06-18 VITALS — Ht 63.5 in | Wt 127.0 lb

## 2019-06-18 DIAGNOSIS — M51369 Other intervertebral disc degeneration, lumbar region without mention of lumbar back pain or lower extremity pain: Secondary | ICD-10-CM

## 2019-06-18 DIAGNOSIS — R454 Irritability and anger: Secondary | ICD-10-CM | POA: Diagnosis not present

## 2019-06-18 DIAGNOSIS — R41840 Attention and concentration deficit: Secondary | ICD-10-CM | POA: Diagnosis not present

## 2019-06-18 DIAGNOSIS — M545 Low back pain, unspecified: Secondary | ICD-10-CM

## 2019-06-18 DIAGNOSIS — M5136 Other intervertebral disc degeneration, lumbar region: Secondary | ICD-10-CM

## 2019-06-18 DIAGNOSIS — F411 Generalized anxiety disorder: Secondary | ICD-10-CM

## 2019-06-18 LAB — POCT URINALYSIS DIP (CLINITEK)
Bilirubin, UA: NEGATIVE
Blood, UA: NEGATIVE
Glucose, UA: NEGATIVE mg/dL
Ketones, POC UA: NEGATIVE mg/dL
Leukocytes, UA: NEGATIVE
Nitrite, UA: NEGATIVE
POC PROTEIN,UA: NEGATIVE
Spec Grav, UA: 1.025 (ref 1.010–1.025)
Urobilinogen, UA: 0.2 E.U./dL
pH, UA: 5.5 (ref 5.0–8.0)

## 2019-06-18 MED ORDER — GABAPENTIN 300 MG PO CAPS: ORAL_CAPSULE | ORAL | 3 refills | Status: DC

## 2019-06-18 MED ORDER — CELECOXIB 200 MG PO CAPS: ORAL_CAPSULE | ORAL | 2 refills | Status: DC

## 2019-06-18 MED ORDER — AMPHETAMINE-DEXTROAMPHET ER 10 MG PO CP24: 10.0000 mg | ORAL_CAPSULE | Freq: Every day | ORAL | 0 refills | Status: DC

## 2019-06-18 MED ORDER — DESVENLAFAXINE SUCCINATE ER 50 MG PO TB24: 50.0000 mg | ORAL_TABLET | Freq: Every day | ORAL | 1 refills | Status: DC

## 2019-06-18 NOTE — Progress Notes (Signed)
    Procedures performed today:    None.  Independent interpretation of tests performed by another provider:   None.  Impression and Recommendations:    Lumbar degenerative disc disease Debra Massey is a pleasant 49 year old female with known L4-L5 degenerative disc disease on an MRI from 2009. Overall she has been doing well, the last time I saw her for this was in March 2018. 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 Urinalysis is clear today. Pain is worse with standing, better with sitting, nothing radicular, no bowel or bladder dysfunction, saddle numbness, constitutional symptoms. We are going to start conservative, Celebrex, gabapentin. Home rehab exercises given, return to see me in 4 to 6 weeks, we can add additional imaging if no better.    ___________________________________________ Ihor Austin. Benjamin Stain, M.D., ABFM., CAQSM. Primary Care and Sports Medicine [facility]  Adjunct Instructor of Family Medicine  [employer]

## 2019-06-18 NOTE — Progress Notes (Signed)
   Subjective:    Patient ID: Debra Massey, female    DOB: 09-30-1970, 49 y.o.   MRN: [id number]  HPI Pt is a 49 yo female with inattention, irritability, anger, anxiety who presents to the clinic for medication refill. Pt is doing great. No concerns or complaints. No problems sleeping or at work. Energy is good. No SI/HC. Good appetite.   .. Active Ambulatory Problems    Diagnosis Date Noted  . Primary osteoarthritis of both hands 10/02/2012  . Lumbar degenerative disc disease 11/01/2012  . 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 Tinea pedis 01/07/2014  . Smoker 12/17/2014  . GAD (generalized anxiety disorder) 04/19/2016  . Drooping eyelid, right 05/03/2016  . Peripheral vision loss, right 05/03/2016  . Left shoulder pain 07/19/2016  . 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 Skin tear of left hand without complication 03/28/2017  . Preventive measure 03/28/2017  . Pap smear abnormality of cervix/human papillomavirus (HPV) positive 04/10/2017  . Rhinorrhea 01/24/2018  . Lymphadenopathy 01/24/2018  . Nabothian cyst 06/27/2018  . Post-menopausal bleeding 06/27/2018  . Atypical squamous cell changes of undetermined significance (ASCUS) on cervical cytology with negative high risk human papilloma virus (HPV) test result 06/29/2018  . Panic attacks 10/06/2018  . No energy 01/05/2019  . Memory changes 01/05/2019  . Inattention 01/05/2019  . Irritability and anger 02/13/2019   Resolved Ambulatory Problems    Diagnosis Date Noted  . No Resolved Ambulatory Problems   Past Medical History:  Diagnosis Date  . Arthritis       Review of Systems  All other systems reviewed and are negative.      Objective:   Physical Exam Vitals reviewed.  Constitutional:      Appearance: Normal appearance.  HENT:     Head: Normocephalic.  Cardiovascular:     Rate and Rhythm: Normal rate and regular rhythm.     Pulses: Normal pulses.  Pulmonary:     Effort: Pulmonary effort is normal.   Neurological:     General: No focal deficit present.     Mental Status: She is alert and oriented to person, place, and time.           Assessment & Plan:  Marland KitchenMarland KitchenAndie was seen today for medication management.  Diagnoses and all orders for this visit:  Irritability and anger -     desvenlafaxine (PRISTIQ) 50 MG 24 hr tablet; Take 1 tablet (50 mg total) by mouth daily.  GAD (generalized anxiety disorder) -     desvenlafaxine (PRISTIQ) 50 MG 24 hr tablet; Take 1 tablet (50 mg total) by mouth daily.  Inattention -     amphetamine-dextroamphetamine (ADDERALL XR) 10 MG 24 hr capsule; Take 1 capsule (10 mg total) by mouth daily. -     amphetamine-dextroamphetamine (ADDERALL XR) 10 MG 24 hr capsule; Take 1 capsule (10 mg total) by mouth daily. -     amphetamine-dextroamphetamine (ADDERALL XR) 10 MG 24 hr capsule; Take 1 capsule (10 mg total) by mouth daily.   Refilled for 3 months.  Weight stable.  Mood great.

## 2019-06-18 NOTE — Assessment & Plan Note (Signed)
Debra Massey is a pleasant 49 year old female with known L4-L5 degenerative disc disease on an MRI from 2009. Overall she has been doing well, the last time I saw her for this was in March 2018. 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 Urinalysis is clear today. Pain is worse with standing, better with sitting, nothing radicular, no bowel or bladder dysfunction, saddle numbness, constitutional symptoms. We are going to start conservative, Celebrex, gabapentin. Home rehab exercises given, return to see me in 4 to 6 weeks, we can add additional imaging if no better.

## 2019-11-14 ENCOUNTER — Ambulatory Visit: Payer: BC Managed Care – PPO | Admitting: Physician Assistant

## 2019-11-17 ENCOUNTER — Other Ambulatory Visit: Payer: Self-pay | Admitting: Sports Medicine

## 2019-11-17 MED ORDER — PREDNISONE 50 MG PO TABS
ORAL_TABLET | ORAL | 0 refills | Status: DC
Start: 2019-11-17 — End: 2019-11-26

## 2019-11-26 ENCOUNTER — Encounter: Payer: Self-pay | Admitting: Physician Assistant

## 2019-11-26 ENCOUNTER — Ambulatory Visit (INDEPENDENT_AMBULATORY_CARE_PROVIDER_SITE_OTHER): Payer: BC Managed Care – PPO | Admitting: Physician Assistant

## 2019-11-26 VITALS — BP 130/74 | HR 60 | Ht 63.5 in | Wt 117.0 lb

## 2019-11-26 DIAGNOSIS — F411 Generalized anxiety disorder: Secondary | ICD-10-CM | POA: Diagnosis not present

## 2019-11-26 DIAGNOSIS — Z1159 Encounter for screening for other viral diseases: Secondary | ICD-10-CM

## 2019-11-26 DIAGNOSIS — R41840 Attention and concentration deficit: Secondary | ICD-10-CM

## 2019-11-26 DIAGNOSIS — R454 Irritability and anger: Secondary | ICD-10-CM

## 2019-11-26 DIAGNOSIS — Z13220 Encounter for screening for lipoid disorders: Secondary | ICD-10-CM

## 2019-11-26 DIAGNOSIS — Z131 Encounter for screening for diabetes mellitus: Secondary | ICD-10-CM

## 2019-11-26 DIAGNOSIS — N952 Postmenopausal atrophic vaginitis: Secondary | ICD-10-CM | POA: Insufficient documentation

## 2019-11-26 DIAGNOSIS — Z780 Asymptomatic menopausal state: Secondary | ICD-10-CM

## 2019-11-26 MED ORDER — DESVENLAFAXINE SUCCINATE ER 50 MG PO TB24: 50.0000 mg | ORAL_TABLET | Freq: Every day | ORAL | 2 refills | Status: DC

## 2019-11-26 MED ORDER — AMPHETAMINE-DEXTROAMPHET ER 10 MG PO CP24: 10.0000 mg | ORAL_CAPSULE | Freq: Every day | ORAL | 0 refills | Status: DC

## 2019-11-26 MED ORDER — ESTRADIOL 10 MCG VA TABS: ORAL_TABLET | VAGINAL | 12 refills | Status: DC

## 2019-11-26 NOTE — Progress Notes (Signed)
Subjective:    Patient ID: Debra Massey, female    DOB: 09-Mar-1971, 49 y.o.   MRN: [id number]  HPI  Pt is a 49 yo female with inattention, MDD, GAD who presents to the clinic for medication refill and follow up.   She never got pristiq because of cost. Wonders about other options.   Doing well with adderall. No problems or concerns. Doing great at work and with focus. Eating a little more.   .. Active Ambulatory Problems    Diagnosis Date Noted  . Primary osteoarthritis of both hands 10/02/2012  . Lumbar degenerative disc disease 11/01/2012  . 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 Tinea pedis 01/07/2014  . Smoker 12/17/2014  . GAD (generalized anxiety disorder) 04/19/2016  . Drooping eyelid, right 05/03/2016  . Peripheral vision loss, right 05/03/2016  . Left shoulder pain 07/19/2016  . 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 Skin tear of left hand without complication 03/28/2017  . Preventive measure 03/28/2017  . Pap smear abnormality of cervix/human papillomavirus (HPV) positive 04/10/2017  . Rhinorrhea 01/24/2018  . Lymphadenopathy 01/24/2018  . Nabothian cyst 06/27/2018  . Post-menopausal 06/27/2018  . Atypical squamous cell changes of undetermined significance (ASCUS) on cervical cytology with negative high risk human papilloma virus (HPV) test result 06/29/2018  . Panic attacks 10/06/2018  . No energy 01/05/2019  . Memory changes 01/05/2019  . Inattention 01/05/2019  . Irritability and anger 02/13/2019  . Atrophic vaginitis 11/26/2019   Resolved Ambulatory Problems    Diagnosis Date Noted  . No Resolved Ambulatory Problems   Past Medical History:  Diagnosis Date  . Arthritis      Review of Systems  All other systems reviewed and are negative.      Objective:   Physical Exam Vitals reviewed.  Constitutional:      Appearance: Normal appearance.  HENT:     Head: Normocephalic.  Cardiovascular:     Rate and Rhythm: Normal rate and regular rhythm.      Pulses: Normal pulses.  Pulmonary:     Effort: Pulmonary effort is normal.     Breath sounds: Normal breath sounds.  Neurological:     General: No focal deficit present.     Mental Status: She is alert and oriented to person, place, and time.  Psychiatric:        Mood and Affect: Mood normal.           Assessment & Plan:  Marland KitchenMarland KitchenDerriana was seen today for follow-up.  Diagnoses and all orders for this visit:  Inattention -     amphetamine-dextroamphetamine (ADDERALL XR) 10 MG 24 hr capsule; Take 1 capsule (10 mg total) by mouth daily. -     amphetamine-dextroamphetamine (ADDERALL XR) 10 MG 24 hr capsule; Take 1 capsule (10 mg total) by mouth daily. -     amphetamine-dextroamphetamine (ADDERALL XR) 10 MG 24 hr capsule; Take 1 capsule (10 mg total) by mouth daily. -     TSH -     CBC  Irritability and anger -     desvenlafaxine (PRISTIQ) 50 MG 24 hr tablet; Take 1 tablet (50 mg total) by mouth daily. -     TSH -     CBC  GAD (generalized anxiety disorder) -     desvenlafaxine (PRISTIQ) 50 MG 24 hr tablet; Take 1 tablet (50 mg total) by mouth daily.  Encounter for hepatitis C screening test for low risk patient -     Hepatitis C Antibody  Screening  for diabetes mellitus -     COMPLETE METABOLIC PANEL WITH GFR  Screening for lipid disorders -     Lipid Panel w/reflex Direct LDL  Post-menopausal -     VITAMIN D 25 Hydroxy (Vit-D Deficiency, Fractures) -     B12 and Folate Panel  Atrophic vaginitis -     Estradiol 10 MCG TABS vaginal tablet; Use tablet nightly per vagina for 2 weeks, then 2 nights per week   GYN retired. Refilled estradiol. Post menopausal. Low risk for DVT/stroke due to less systemic absorption. Continue to work on smoking cessation.   Fasting labs ordered.   Refilled adderall for 3 months.   Added pristiq via [facility] to make more affordable.

## 2019-11-27 ENCOUNTER — Encounter: Payer: Self-pay | Admitting: Physician Assistant

## 2020-01-28 DIAGNOSIS — H40013 Open angle with borderline findings, low risk, bilateral: Secondary | ICD-10-CM | POA: Diagnosis not present

## 2020-02-11 ENCOUNTER — Ambulatory Visit (INDEPENDENT_AMBULATORY_CARE_PROVIDER_SITE_OTHER): Payer: BC Managed Care – PPO | Admitting: Sports Medicine

## 2020-02-11 ENCOUNTER — Encounter: Payer: Self-pay | Admitting: Sports Medicine

## 2020-02-11 ENCOUNTER — Ambulatory Visit (INDEPENDENT_AMBULATORY_CARE_PROVIDER_SITE_OTHER): Payer: BC Managed Care – PPO

## 2020-02-11 ENCOUNTER — Other Ambulatory Visit: Payer: Self-pay

## 2020-02-11 DIAGNOSIS — M545 Low back pain: Secondary | ICD-10-CM | POA: Diagnosis not present

## 2020-02-11 DIAGNOSIS — M4807 Spinal stenosis, lumbosacral region: Secondary | ICD-10-CM

## 2020-02-11 DIAGNOSIS — M51369 Other intervertebral disc degeneration, lumbar region without mention of lumbar back pain or lower extremity pain: Secondary | ICD-10-CM

## 2020-02-11 DIAGNOSIS — M5136 Other intervertebral disc degeneration, lumbar region: Secondary | ICD-10-CM

## 2020-02-11 DIAGNOSIS — M4804 Spinal stenosis, thoracic region: Secondary | ICD-10-CM | POA: Diagnosis not present

## 2020-02-11 MED ORDER — PREDNISONE 50 MG PO TABS: ORAL_TABLET | ORAL | 0 refills | Status: DC

## 2020-02-11 NOTE — Assessment & Plan Note (Signed)
49-year-old female with known L4-L5 DDD, last MRI was in 2009. She has been working as a Scientist, research (medical), unfortunately she starting to have increasing pain. Better with flexion. Nothing overtly radicular, she continues with gabapentin, Celebrex. We are going to proceed with x-rays and MRI for interventional planning as she has failed greater than 6 weeks of conservative measures. Adding the rehab exercises, 5-day burst of prednisone, epidural if no better. Hopefully we can get her MRI done today.

## 2020-02-11 NOTE — Progress Notes (Signed)
° ° °  Procedures performed today:    None.  Independent interpretation of notes and tests performed by another provider:   None.  Brief History, Exam, Impression, and Recommendations:    Debra Massey is a pleasant 49yo female who presents today with back pain. It is located in her lower right back. She says the pain has developed over many years. It causes her pain mainly with walking when she lifts her right leg. She denies any numbness or tingling in her legs. She notices sometimes she is off balance from the shock of the pain. Standing up straight seems to make it worse.  An MRI in 2009 demonstrated L4-L5 DDD. She was started on celebrex and gabapentin in February which helped to control some of the pain but have not provided significant relief. We are going to get an updated Xray and  MRI of her lumbar spine today. We are also going to give her a 5 day prednisone burst. If no better, we can set her up for an epidural. She can follow up in 4-6 weeks to reevaluate.   Debra Massey, MS3   ___________________________________________ Debra Massey. Benjamin Stain, M.D., ABFM., CAQSM. Primary Care and Sports Medicine [facility]  Adjunct Instructor of Family Medicine  [employer]

## 2020-03-17 ENCOUNTER — Encounter: Payer: Self-pay | Admitting: Physician Assistant

## 2020-03-17 ENCOUNTER — Other Ambulatory Visit: Payer: Self-pay

## 2020-03-17 ENCOUNTER — Ambulatory Visit (INDEPENDENT_AMBULATORY_CARE_PROVIDER_SITE_OTHER): Payer: BC Managed Care – PPO | Admitting: Physician Assistant

## 2020-03-17 VITALS — BP 118/82 | HR 71 | Temp 98.4°F | Wt 122.1 lb

## 2020-03-17 DIAGNOSIS — Z0000 Encounter for general adult medical examination without abnormal findings: Secondary | ICD-10-CM

## 2020-03-17 DIAGNOSIS — R454 Irritability and anger: Secondary | ICD-10-CM | POA: Diagnosis not present

## 2020-03-17 DIAGNOSIS — Z1211 Encounter for screening for malignant neoplasm of colon: Secondary | ICD-10-CM

## 2020-03-17 DIAGNOSIS — F411 Generalized anxiety disorder: Secondary | ICD-10-CM | POA: Diagnosis not present

## 2020-03-17 DIAGNOSIS — Z23 Encounter for immunization: Secondary | ICD-10-CM | POA: Diagnosis not present

## 2020-03-17 DIAGNOSIS — Z1329 Encounter for screening for other suspected endocrine disorder: Secondary | ICD-10-CM

## 2020-03-17 DIAGNOSIS — N952 Postmenopausal atrophic vaginitis: Secondary | ICD-10-CM | POA: Diagnosis not present

## 2020-03-17 DIAGNOSIS — Z1231 Encounter for screening mammogram for malignant neoplasm of breast: Secondary | ICD-10-CM

## 2020-03-17 DIAGNOSIS — Z13220 Encounter for screening for lipoid disorders: Secondary | ICD-10-CM | POA: Diagnosis not present

## 2020-03-17 DIAGNOSIS — E559 Vitamin D deficiency, unspecified: Secondary | ICD-10-CM

## 2020-03-17 DIAGNOSIS — R41840 Attention and concentration deficit: Secondary | ICD-10-CM

## 2020-03-17 DIAGNOSIS — M5136 Other intervertebral disc degeneration, lumbar region: Secondary | ICD-10-CM

## 2020-03-17 DIAGNOSIS — Z1159 Encounter for screening for other viral diseases: Secondary | ICD-10-CM

## 2020-03-17 MED ORDER — AMPHETAMINE-DEXTROAMPHET ER 10 MG PO CP24: 10.0000 mg | ORAL_CAPSULE | Freq: Every day | ORAL | 0 refills | Status: DC

## 2020-03-17 MED ORDER — ESTRADIOL 10 MCG VA TABS: ORAL_TABLET | VAGINAL | 1 refills | Status: DC

## 2020-03-17 MED ORDER — DESVENLAFAXINE SUCCINATE ER 50 MG PO TB24: 50.0000 mg | ORAL_TABLET | Freq: Every day | ORAL | 11 refills | Status: DC

## 2020-03-17 NOTE — Progress Notes (Signed)
Subjective:     Debra Massey is a 49 y.o. female and is here for a comprehensive physical exam. The patient reports no problems.  Pt does need labs and refills. No new problems.   Doing great on adderall and pristiq.    Social History   Socioeconomic History  . Marital status: Married    Spouse name: Not on file  . Number of children: Not on file  . Years of education: Not on file  . Highest education level: Not on file  Occupational History  . Not on file  Tobacco Use  . Smoking status: Current Some Day Smoker    Packs/day: 1.00    Years: 27.00    Pack years: 27.00    Types: Cigarettes, E-cigarettes  . Smokeless tobacco: Never Used  . Tobacco comment: pt is vaping  Vaping Use  . Vaping Use: Every day  Substance and Sexual Activity  . Alcohol use: No  . Drug use: No  . Sexual activity: Yes    Birth control/protection: None  Other Topics Concern  . Not on file  Social History Narrative  . Not on file   Social Determinants of Health   Financial Resource Strain:   . Difficulty of Paying Living Expenses: Not on file  Food Insecurity:   . Worried About Programme researcher, broadcasting/film/video in the Last Year: Not on file  . Ran Out of Food in the Last Year: Not on file  Transportation Needs:   . Lack of Transportation (Medical): Not on file  . Lack of Transportation (Non-Medical): Not on file  Physical Activity:   . Days of Exercise per Week: Not on file  . Minutes of Exercise per Session: Not on file  Stress:   . Feeling of Stress : Not on file  Social Connections:   . Frequency of Communication with Friends and Family: Not on file  . Frequency of Social Gatherings with Friends and Family: Not on file  . Attends Religious Services: Not on file  . Active Member of Clubs or Organizations: Not on file  . Attends Banker Meetings: Not on file  . Marital Status: Not on file  Intimate Partner Violence:   . Fear of Current or Ex-Partner: Not on file  . Emotionally Abused:  Not on file  . Physically Abused: Not on file  . Sexually Abused: Not on file   Health Maintenance  Topic Date Due  . Hepatitis C Screening  Never done  . MAMMOGRAM  07/06/2019  . PAP SMEAR-Modifier  03/17/2020 (Originally 06/27/2019)  . COVID-19 Vaccine (1) 04/02/2020 (Originally 12/29/1983)  . HIV Screening  03/17/2021 (Originally 12/29/1986)  . TETANUS/TDAP  04/20/2021  . INFLUENZA VACCINE  Completed    The following portions of the patient's history were reviewed and updated as appropriate: allergies, current medications, past family history, past medical history, past social history, past surgical history and problem list.  Review of Systems Pertinent items noted in HPI and remainder of comprehensive ROS otherwise negative.   Objective:    BP 118/82 (BP Location: Right Arm, Patient Position: Sitting, Cuff Size: Normal)   Pulse 71   Temp 98.4 F (36.9 C) (Oral)   Wt 122 lb 1.9 oz (55.4 kg)   LMP 07/05/2016   BMI 21.29 kg/m  General appearance: alert, cooperative and appears stated age Head: Normocephalic, without obvious abnormality, atraumatic Eyes: conjunctivae/corneas clear. PERRL, EOM's intact. Fundi benign. Ears: normal TM's and external ear canals both ears Nose: Nares normal. Septum  midline. Mucosa normal. No drainage or sinus tenderness. Throat: lips, mucosa, and tongue normal; teeth and gums normal Neck: no adenopathy, no carotid bruit, no JVD, supple, symmetrical, trachea midline and thyroid not enlarged, symmetric, no tenderness/mass/nodules Back: symmetric, no curvature. ROM normal. No CVA tenderness. Lungs: clear to auscultation bilaterally Heart: regular rate and rhythm, S1, S2 normal, no murmur, click, rub or gallop Abdomen: soft, non-tender; bowel sounds normal; no masses,  no organomegaly Extremities: extremities normal, atraumatic, no cyanosis or edema Pulses: 2+ and symmetric Skin: Skin color, texture, turgor normal. No rashes or lesions Lymph nodes:  Cervical, supraclavicular, and axillary nodes normal. Neurologic: Alert and oriented X 3, normal strength and tone. Normal symmetric reflexes. Normal coordination and gait   .[name] Depression screen [facility] 2/9 03/17/2020 11/26/2019 06/18/2019 02/13/2019 01/03/2019  Decreased Interest 1 1 2 1 1   Down, Depressed, Hopeless 0 0 1 0 0  PHQ - 2 Score 1 1 3 1 1   Altered sleeping - 0 0 0 0  Tired, decreased energy - 1 1 1 2   Change in appetite - 0 0 0 0  Feeling bad or failure about yourself  - 0 1 0 0  Trouble concentrating - 1 1 2 3   Moving slowly or fidgety/restless - 0 0 1 0  Suicidal thoughts - 0 0 0 0  PHQ-9 Score - 3 6 5 6   Difficult doing work/chores - Not difficult at all Somewhat difficult Somewhat difficult Somewhat difficult  Some recent data might be hidden   Assessment:    Healthy female exam.      Plan:     Giani was seen today for annual exam.  Diagnoses and all orders for this visit:  Routine physical examination -     MM 3D SCREEN BREAST BILATERAL -     Ambulatory referral to Gastroenterology -     Hepatitis C Antibody -     Lipid Panel w/reflex Direct LDL -     COMPLETE METABOLIC PANEL WITH GFR -     VITAMIN D 25 Hydroxy (Vit-D Deficiency, Fractures) -     B12 and Folate Panel -     CBC -     TSH  Atrophic vaginitis -     Estradiol 10 MCG TABS vaginal tablet; Use tablet nightly per vagina for 2 weeks, then 2 nights per week  Irritability and anger -     desvenlafaxine (PRISTIQ) 50 MG 24 hr tablet; Take 1 tablet (50 mg total) by mouth daily.  GAD (generalized anxiety disorder) -     desvenlafaxine (PRISTIQ) 50 MG 24 hr tablet; Take 1 tablet (50 mg total) by mouth daily.  Lumbar degenerative disc disease  Inattention -     amphetamine-dextroamphetamine (ADDERALL XR) 10 MG 24 hr capsule; Take 1 capsule (10 mg total) by mouth daily. -     amphetamine-dextroamphetamine (ADDERALL XR) 10 MG 24 hr capsule; Take 1 capsule (10 mg total) by mouth daily. -      amphetamine-dextroamphetamine (ADDERALL XR) 10 MG 24 hr capsule; Take 1 capsule (10 mg total) by mouth daily.  Visit for screening mammogram -     MM 3D SCREEN BREAST BILATERAL  Colon cancer screening -     Ambulatory referral to Gastroenterology  Encounter for hepatitis C screening test for low risk patient -     Hepatitis C Antibody  Screening for lipid disorders -     Lipid Panel w/reflex Direct LDL  Thyroid disorder screen -     TSH  Vitamin D insufficiency -     VITAMIN D 25 Hydroxy (Vit-D Deficiency, Fractures)  Need for influenza vaccination -     Flu Vaccine QUAD 6+ mos PF IM (Fluarix Quad PF)   .[name] Discussed 150 minutes of exercise a week.  Encouraged vitamin D 1000 units and Calcium 1300mg  or 4 servings of dairy a day.  Fasting labs ordered.  Last pap 07/2018 with LSIL. 1 year follow up. Needs to make appt. Vaginal estrogen refilled. Not using regularly. Refilled until can get in with GYN.  Colonoscopy ordered due to age and new 79 and up guidelines.  Mammogram ordered.  Flu shot given today.  covid vaccine declined.   Refilled adderall for 3 months.   Depression doing great. Refilled pristiq. PHQ stable.    See After Visit Summary for Counseling Recommendations

## 2020-03-17 NOTE — Patient Instructions (Signed)
Health Maintenance, Female Adopting a healthy lifestyle and getting preventive care are important in promoting health and wellness. Ask your health care provider about:  The right schedule for you to have regular tests and exams.  Things you can do on your own to prevent diseases and keep yourself healthy. What should I know about diet, weight, and exercise? Eat a healthy diet   Eat a diet that includes plenty of vegetables, fruits, low-fat dairy products, and lean protein.  Do not eat a lot of foods that are high in solid fats, added sugars, or sodium. Maintain a healthy weight Body mass index (BMI) is used to identify weight problems. It estimates body fat based on height and weight. Your health care provider can help determine your BMI and help you achieve or maintain a healthy weight. Get regular exercise Get regular exercise. This is one of the most important things you can do for your health. Most adults should:  Exercise for at least 150 minutes each week. The exercise should increase your heart rate and make you sweat (moderate-intensity exercise).  Do strengthening exercises at least twice a week. This is in addition to the moderate-intensity exercise.  Spend less time sitting. Even light physical activity can be beneficial. Watch cholesterol and blood lipids Have your blood tested for lipids and cholesterol at 48 years of age, then have this test every 5 years. Have your cholesterol levels checked more often if:  Your lipid or cholesterol levels are high.  You are older than 48 years of age.  You are at high risk for heart disease. What should I know about cancer screening? Depending on your health history and family history, you may need to have cancer screening at various ages. This may include screening for:  Breast cancer.  Cervical cancer.  Colorectal cancer.  Skin cancer.  Lung cancer. What should I know about heart disease, diabetes, and high blood  pressure? Blood pressure and heart disease  High blood pressure causes heart disease and increases the risk of stroke. This is more likely to develop in people who have high blood pressure readings, are of African descent, or are overweight.  Have your blood pressure checked: ? Every 3-5 years if you are 18-39 years of age. ? Every year if you are 40 years old or older. Diabetes Have regular diabetes screenings. This checks your fasting blood sugar level. Have the screening done:  Once every three years after age 40 if you are at a normal weight and have a low risk for diabetes.  More often and at a younger age if you are overweight or have a high risk for diabetes. What should I know about preventing infection? Hepatitis B If you have a higher risk for hepatitis B, you should be screened for this virus. Talk with your health care provider to find out if you are at risk for hepatitis B infection. Hepatitis C Testing is recommended for:  Everyone born from 1945 through 1965.  Anyone with known risk factors for hepatitis C. Sexually transmitted infections (STIs)  Get screened for STIs, including gonorrhea and chlamydia, if: ? You are sexually active and are younger than 48 years of age. ? You are older than 48 years of age and your health care provider tells you that you are at risk for this type of infection. ? Your sexual activity has changed since you were last screened, and you are at increased risk for chlamydia or gonorrhea. Ask your health care provider if   you are at risk.  Ask your health care provider about whether you are at high risk for HIV. Your health care provider may recommend a prescription medicine to help prevent HIV infection. If you choose to take medicine to prevent HIV, you should first get tested for HIV. You should then be tested every 3 months for as long as you are taking the medicine. Pregnancy  If you are about to stop having your period (premenopausal) and  you may become pregnant, seek counseling before you get pregnant.  Take 400 to 800 micrograms (mcg) of folic acid every day if you become pregnant.  Ask for birth control (contraception) if you want to prevent pregnancy. Osteoporosis and menopause Osteoporosis is a disease in which the bones lose minerals and strength with aging. This can result in bone fractures. If you are 65 years old or older, or if you are at risk for osteoporosis and fractures, ask your health care provider if you should:  Be screened for bone loss.  Take a calcium or vitamin D supplement to lower your risk of fractures.  Be given hormone replacement therapy (HRT) to treat symptoms of menopause. Follow these instructions at home: Lifestyle  Do not use any products that contain nicotine or tobacco, such as cigarettes, e-cigarettes, and chewing tobacco. If you need help quitting, ask your health care provider.  Do not use street drugs.  Do not share needles.  Ask your health care provider for help if you need support or information about quitting drugs. Alcohol use  Do not drink alcohol if: ? Your health care provider tells you not to drink. ? You are pregnant, may be pregnant, or are planning to become pregnant.  If you drink alcohol: ? Limit how much you use to 0-1 drink a day. ? Limit intake if you are breastfeeding.  Be aware of how much alcohol is in your drink. In the [place] one drink equals one 12 oz bottle of beer (355 mL), one 5 oz glass of wine (148 mL), or one 1 oz glass of hard liquor (44 mL). General instructions  Schedule regular health, dental, and eye exams.  Stay current with your vaccines.  Tell your health care provider if: ? You often feel depressed. ? You have ever been abused or do not feel safe at home. Summary  Adopting a healthy lifestyle and getting preventive care are important in promoting health and wellness.  Follow your health care provider's instructions about healthy  diet, exercising, and getting tested or screened for diseases.  Follow your health care provider's instructions on monitoring your cholesterol and blood pressure. This information is not intended to replace advice given to you by your health care provider. Make sure you discuss any questions you have with your health care provider. Document Revised: 04/26/2018 Document Reviewed: 04/26/2018 Elsevier Patient Education  2020 [employer]  

## 2020-03-18 ENCOUNTER — Encounter: Payer: Self-pay | Admitting: Physician Assistant

## 2020-03-18 DIAGNOSIS — E559 Vitamin D deficiency, unspecified: Secondary | ICD-10-CM | POA: Insufficient documentation

## 2020-03-18 LAB — COMPLETE METABOLIC PANEL WITH GFR
AG Ratio: 2 (calc) (ref 1.0–2.5)
ALT: 24 U/L (ref 6–29)
AST: 20 U/L (ref 10–35)
Albumin: 4.5 g/dL (ref 3.6–5.1)
Alkaline phosphatase (APISO): 53 U/L (ref 31–125)
BUN: 10 mg/dL (ref 7–25)
CO2: 26 mmol/L (ref 20–32)
Calcium: 9.4 mg/dL (ref 8.6–10.2)
Chloride: 106 mmol/L (ref 98–110)
Creat: 0.87 mg/dL (ref 0.50–1.10)
GFR, Est African American: 91 mL/min/{1.73_m2} (ref >=60)
GFR, Est Non African American: 79 mL/min/{1.73_m2} (ref >=60)
Globulin: 2.3 g/dL (calc) (ref 1.9–3.7)
Glucose, Bld: 84 mg/dL (ref 65–139)
Potassium: 4.3 mmol/L (ref 3.5–5.3)
Sodium: 139 mmol/L (ref 135–146)
Total Bilirubin: 0.4 mg/dL (ref 0.2–1.2)
Total Protein: 6.8 g/dL (ref 6.1–8.1)

## 2020-03-18 LAB — HEPATITIS C ANTIBODY
Hepatitis C Ab: NONREACTIVE
SIGNAL TO CUT-OFF: 0.01 (ref <1.00)

## 2020-03-18 LAB — TSH: TSH: 2.49 mIU/L

## 2020-03-18 LAB — VITAMIN D 25 HYDROXY (VIT D DEFICIENCY, FRACTURES): Vit D, 25-Hydroxy: 24 ng/mL — ABNORMAL LOW (ref 30–100)

## 2020-03-18 LAB — B12 AND FOLATE PANEL
Folate: 10.8 ng/mL
Vitamin B-12: 903 pg/mL (ref 200–1100)

## 2020-03-18 LAB — CBC
HCT: 45.1 % — ABNORMAL HIGH (ref 35.0–45.0)
Hemoglobin: 14.7 g/dL (ref 11.7–15.5)
MCH: 28.7 pg (ref 27.0–33.0)
MCHC: 32.6 g/dL (ref 32.0–36.0)
MCV: 87.9 fL (ref 80.0–100.0)
MPV: 10.7 fL (ref 7.5–12.5)
Platelets: 270 10*3/uL (ref 140–400)
RBC: 5.13 10*6/uL — ABNORMAL HIGH (ref 3.80–5.10)
RDW: 12.6 % (ref 11.0–15.0)
WBC: 6.8 10*3/uL (ref 3.8–10.8)

## 2020-03-18 LAB — LIPID PANEL W/REFLEX DIRECT LDL
Cholesterol: 174 mg/dL (ref <200)
HDL: 65 mg/dL (ref >=50)
LDL Cholesterol (Calc): 89 mg/dL (calc)
Non-HDL Cholesterol (Calc): 109 mg/dL (calc) (ref <130)
Total CHOL/HDL Ratio: 2.7 (calc) (ref <5.0)
Triglycerides: 106 mg/dL (ref <150)

## 2020-03-18 NOTE — Progress Notes (Signed)
Sammi,   Cholesterol looks really good! Kidney, liver, glucose look great.  B12 perfect.  Thyroid great.  Vitamin D is low. 2000 units daily with calcium.

## 2020-05-30 ENCOUNTER — Other Ambulatory Visit: Payer: Self-pay

## 2020-05-30 ENCOUNTER — Encounter: Payer: Self-pay | Admitting: Nurse Practitioner

## 2020-05-30 ENCOUNTER — Ambulatory Visit (INDEPENDENT_AMBULATORY_CARE_PROVIDER_SITE_OTHER): Payer: BC Managed Care – PPO | Admitting: Nurse Practitioner

## 2020-05-30 VITALS — BP 117/81 | HR 63 | Temp 98.4°F | Ht 63.5 in | Wt 122.0 lb

## 2020-05-30 DIAGNOSIS — N952 Postmenopausal atrophic vaginitis: Secondary | ICD-10-CM

## 2020-05-30 DIAGNOSIS — R319 Hematuria, unspecified: Secondary | ICD-10-CM | POA: Diagnosis not present

## 2020-05-30 DIAGNOSIS — R300 Dysuria: Secondary | ICD-10-CM | POA: Diagnosis not present

## 2020-05-30 LAB — POCT URINALYSIS DIP (CLINITEK)
Bilirubin, UA: NEGATIVE
Blood, UA: NEGATIVE
Glucose, UA: NEGATIVE mg/dL
Leukocytes, UA: NEGATIVE
Nitrite, UA: NEGATIVE
POC PROTEIN,UA: NEGATIVE
Spec Grav, UA: 1.02 (ref 1.010–1.025)
Urobilinogen, UA: 0.2 E.U./dL
pH, UA: 5 (ref 5.0–8.0)

## 2020-05-30 MED ORDER — ESTROGENS, CONJUGATED 0.625 MG/GM VA CREA: TOPICAL_CREAM | VAGINAL | 12 refills | Status: DC

## 2020-05-30 NOTE — Progress Notes (Signed)
Acute Office Visit  Subjective:    Patient ID: Debra Massey, female    DOB: 08-16-1970, 50 y.o.   MRN: [id number]  Chief Complaint  Patient presents with  . Hematuria    HPI Patient is in today for suspected blood noted on her incontinence pad in the area of the urethra on Wednesday and Thursday of this week. She reports that she does suffer from vaginal atrophy and she was using estradiol vaginal tabs, but her insurance has not been covering this so she has been unable to use the medication for about the last month. She denies burning with urination, low back pain, pelvic pain, frequent urination, or inability to hold her urine. She does suffer from stress incontinence and this is ongoing. She reports that when she has wiped after urinating she has not seen any blood on the toilet tissue or in the toilet. She has not looked at the external vagina or urethra, but denies any known excoriation or fissures. She has not had intercourse recently.   Past Medical History:  Diagnosis Date  . Arthritis     Past Surgical History:  Procedure Laterality Date  . BREAST BIOPSY    . breast biospy  2008   benign  . CHOLECYSTECTOMY     2010  . laprascopy      Family History  Problem Relation Age of Onset  . Cancer Mother        breast  . Diabetes Mother   . Hypertension Mother   . Diabetes Father     Social History   Socioeconomic History  . Marital status: Married    Spouse name: Not on file  . Number of children: Not on file  . Years of education: Not on file  . Highest education level: Not on file  Occupational History  . Not on file  Tobacco Use  . Smoking status: Current Some Day Smoker    Packs/day: 1.00    Years: 27.00    Pack years: 27.00    Types: Cigarettes, E-cigarettes  . Smokeless tobacco: Never Used  . Tobacco comment: pt is vaping  Vaping Use  . Vaping Use: Every day  Substance and Sexual Activity  . Alcohol use: No  . Drug use: No  . Sexual activity: Yes     Birth control/protection: None  Other Topics Concern  . Not on file  Social History Narrative  . Not on file   Social Determinants of Health   Financial Resource Strain: Not on file  Food Insecurity: Not on file  Transportation Needs: Not on file  Physical Activity: Not on file  Stress: Not on file  Social Connections: Not on file  Intimate Partner Violence: Not on file    Outpatient Medications Prior to Visit  Medication Sig Dispense Refill  . amphetamine-dextroamphetamine (ADDERALL XR) 10 MG 24 hr capsule Take 1 capsule (10 mg total) by mouth daily. 30 capsule 0  . amphetamine-dextroamphetamine (ADDERALL XR) 10 MG 24 hr capsule Take 1 capsule (10 mg total) by mouth daily. 30 capsule 0  . amphetamine-dextroamphetamine (ADDERALL XR) 10 MG 24 hr capsule Take 1 capsule (10 mg total) by mouth daily. 30 capsule 0  . celecoxib (CELEBREX) 200 MG capsule One to 2 tablets by mouth daily as needed for pain. 60 capsule 2  . gabapentin (NEURONTIN) 300 MG capsule One tab PO qHS for a week, then BID for a week, then TID. May double weekly to a max of 3,600mg /day 90 capsule 3  .  Estradiol 10 MCG TABS vaginal tablet Use tablet nightly per vagina for 2 weeks, then 2 nights per week 30 tablet 1  . desvenlafaxine (PRISTIQ) 50 MG 24 hr tablet Take 1 tablet (50 mg total) by mouth daily. 30 tablet 11  . diazepam (VALIUM) 2 MG tablet Take 1 tablet (2 mg total) by mouth every 12 (twelve) hours as needed for anxiety. 30 tablet 1   No facility-administered medications prior to visit.    Allergies  Allergen Reactions  . Hydrocodone-Homatropine Other (See Comments)    Dizziness, almost passed out    Review of Systems All review of systems negative except what is listed in the HPI     Objective:    Physical Exam Vitals and nursing note reviewed.  Constitutional:      Appearance: Normal appearance. She is normal weight.  HENT:     Head: Normocephalic.  Eyes:     Extraocular Movements:  Extraocular movements intact.     Conjunctiva/sclera: Conjunctivae normal.     Pupils: Pupils are equal, round, and reactive to light.  Cardiovascular:     Rate and Rhythm: Normal rate.     Pulses: Normal pulses.  Pulmonary:     Effort: Pulmonary effort is normal.  Abdominal:     General: Abdomen is flat.     Palpations: Abdomen is soft.  Skin:    General: Skin is warm and dry.  Neurological:     General: No focal deficit present.     Mental Status: She is alert and oriented to person, place, and time.  Psychiatric:        Mood and Affect: Mood normal.        Behavior: Behavior normal.        Thought Content: Thought content normal.        Judgment: Judgment normal.     BP 117/81   Pulse 63   Temp 98.4 F (36.9 C)   Ht 5' 3.5" (1.613 m)   Wt 122 lb (55.3 kg)   LMP 07/05/2016   SpO2 100%   BMI 21.27 kg/m  Wt Readings from Last 3 Encounters:  05/30/20 122 lb (55.3 kg)  03/17/20 122 lb 1.9 oz (55.4 kg)  11/26/19 117 lb (53.1 kg)    Health Maintenance Due  Topic Date Due  . MAMMOGRAM  07/06/2019    There are no preventive care reminders to display for this patient.   Lab Results  Component Value Date   TSH 2.49 03/17/2020   Lab Results  Component Value Date   WBC 6.8 03/17/2020   HGB 14.7 03/17/2020   HCT 45.1 (H) 03/17/2020   MCV 87.9 03/17/2020   PLT 270 03/17/2020   Lab Results  Component Value Date   NA 139 03/17/2020   K 4.3 03/17/2020   CO2 26 03/17/2020   GLUCOSE 84 03/17/2020   BUN 10 03/17/2020   CREATININE 0.87 03/17/2020   BILITOT 0.4 03/17/2020   ALKPHOS 37 (L) 04/08/2014   AST 20 03/17/2020   ALT 24 03/17/2020   PROT 6.8 03/17/2020   ALBUMIN 4.9 04/08/2014   CALCIUM 9.4 03/17/2020   Lab Results  Component Value Date   CHOL 174 03/17/2020   Lab Results  Component Value Date   HDL 65 03/17/2020   Lab Results  Component Value Date   LDLCALC 89 03/17/2020   Lab Results  Component Value Date   TRIG 106 03/17/2020   Lab  Results  Component Value Date   CHOLHDL  2.7 03/17/2020   Lab Results  Component Value Date   HGBA1C CANCELED 04/04/2017       Assessment & Plan:   1. Vaginal atrophy History of vaginal atrophy with previous use of vaginal estrogen to help with dryness. Unfortunately, this medication is no longer covered by her insurance and she has not been able to get the medication filled due to excess cost.  Strongly suspect the hematuria is related to external urinary meatus irritation directly related to vaginal atrophy and dryness. The presentation is on the incontinence pad only and consistent with the initialization of voiding, but not detected with midstream voiding.  Will send premarin vaginal cream to be applied to the external vaginal meatus and urinary meatus to see if this helps with dryness and bleeding. Discussed with patient that she may also insert a small amount of the cream into the vagina for internal dryness relief. Recommend daily use for one week then taper to three times a week. May increase frequency by one day at a time if symptoms return.  - conjugated estrogens (PREMARIN) vaginal cream; Apply pea to dime size amount to external vaginal opening daily.  Dispense: 42.5 g; Refill: 12  2. Hematuria, unspecified type UA in office today negative for any findings with the exception of positive ketones. We will send for culture for further examination to look for the presence of microscopic blood or other findings.  See vaginal atrophy for additional findings.  - POCT URINALYSIS DIP (CLINITEK) - Urine Culture - conjugated estrogens (PREMARIN) vaginal cream; Apply pea to dime size amount to external vaginal opening daily.  Dispense: 42.5 g; Refill: 12  Follow-up if symptoms worsen or fail to improve.   Tollie Eth, NP

## 2020-05-30 NOTE — Patient Instructions (Signed)
Atrophic Vaginitis  Atrophic vaginitis is when the lining of the vagina becomes dry and thin. This is most common in women who have stopped having their periods (are in menopause). It usually starts when a woman is 45 to 49 years old. What are the causes? This condition is caused by a drop in a female hormone (estrogen). What increases the risk? You are more likely to develop this condition if:  You take certain medicines.  You have had your ovaries taken out.  You are being treated for cancer.  You have given birth or are breastfeeding.  You are more than 50 years old.  You smoke. What are the signs or symptoms?  Pain during sex.  A feeling of pressure during sex.  Bleeding during sex.  Burning or itching in the vagina.  Burning pain when you pee (urinate).  Fluid coming from your vagina. Some people do not have symptoms. How is this treated?  Using a lubricant before sex.  Using a moisturizer in the vagina.  Using estrogen in the vagina. In some cases, you may not need treatment. Follow these instructions at home: Medicines  Take all medicines only as told by your doctor. This includes medicines for dryness.  Do not use herbal medicines unless your doctor says it is okay. General instructions  Talk with your doctor about treatment.  Do not douche.  Do not use scented: ? Sprays. ? Tampons. ? Soaps.  If sex hurts, try using lubricants right before you have sex. Contact a doctor if:  You have fluid coming from the vagina that is not like normal.  You have a bad smell coming from your vagina.  You have new symptoms.  Your symptoms do not get better when treated.  Your symptoms get worse. Summary  This condition happens when the lining of the vagina becomes dry and thin.  It is most common in women who no longer have periods.  Treatment may include using medicines for dryness.  Call a doctor if your symptoms do not get better. This  information is not intended to replace advice given to you by your health care provider. Make sure you discuss any questions you have with your health care provider. Document Revised: 11/01/2019 Document Reviewed: 11/01/2019 Elsevier Patient Education  2021 Elsevier Inc.   

## 2020-06-01 LAB — URINE CULTURE
MICRO NUMBER:: 11422899
Result:: NO GROWTH
SPECIMEN QUALITY:: ADEQUATE

## 2020-06-02 ENCOUNTER — Ambulatory Visit: Payer: BC Managed Care – PPO | Admitting: Physician Assistant

## 2020-06-02 NOTE — Progress Notes (Signed)
Urine culture was negative. Continue medication as prescribed and let us know if symptoms return or do not improve.

## 2020-06-12 ENCOUNTER — Telehealth: Payer: Self-pay

## 2020-06-12 ENCOUNTER — Ambulatory Visit: Payer: BC Managed Care – PPO

## 2020-06-12 NOTE — Telephone Encounter (Signed)
Fa received from pharmacy for a PA on Adderall XR 10mg . Called and spoke with Savy at . We completed the Coverage Exception form; awaiting response.

## 2020-06-30 ENCOUNTER — Ambulatory Visit (INDEPENDENT_AMBULATORY_CARE_PROVIDER_SITE_OTHER): Payer: BC Managed Care – PPO | Admitting: Physician Assistant

## 2020-06-30 DIAGNOSIS — Z5329 Procedure and treatment not carried out because of patient's decision for other reasons: Secondary | ICD-10-CM

## 2020-06-30 DIAGNOSIS — R41840 Attention and concentration deficit: Secondary | ICD-10-CM

## 2020-06-30 NOTE — Progress Notes (Signed)
No show

## 2020-07-10 ENCOUNTER — Ambulatory Visit (INDEPENDENT_AMBULATORY_CARE_PROVIDER_SITE_OTHER): Payer: BC Managed Care – PPO

## 2020-07-10 ENCOUNTER — Other Ambulatory Visit: Payer: Self-pay

## 2020-07-10 DIAGNOSIS — Z0000 Encounter for general adult medical examination without abnormal findings: Secondary | ICD-10-CM | POA: Diagnosis not present

## 2020-07-10 DIAGNOSIS — Z1231 Encounter for screening mammogram for malignant neoplasm of breast: Secondary | ICD-10-CM

## 2020-07-15 NOTE — Progress Notes (Signed)
Normal mammogram. Follow up in 1 year.

## 2020-07-24 ENCOUNTER — Encounter: Payer: Self-pay | Admitting: Physician Assistant

## 2020-07-31 DIAGNOSIS — M5136 Other intervertebral disc degeneration, lumbar region: Secondary | ICD-10-CM | POA: Diagnosis not present

## 2020-07-31 DIAGNOSIS — M51369 Other intervertebral disc degeneration, lumbar region without mention of lumbar back pain or lower extremity pain: Secondary | ICD-10-CM

## 2020-07-31 MED ORDER — PREDNISONE 50 MG PO TABS: ORAL_TABLET | ORAL | 0 refills | Status: DC

## 2020-07-31 NOTE — Telephone Encounter (Signed)
I spent 5 total minutes of online digital evaluation and management services. 

## 2020-08-18 ENCOUNTER — Encounter: Payer: Self-pay | Admitting: Physician Assistant

## 2020-08-18 ENCOUNTER — Ambulatory Visit (INDEPENDENT_AMBULATORY_CARE_PROVIDER_SITE_OTHER): Payer: BC Managed Care – PPO | Admitting: Physician Assistant

## 2020-08-18 VITALS — BP 138/72 | HR 60 | Ht 63.5 in | Wt 120.0 lb

## 2020-08-18 DIAGNOSIS — F411 Generalized anxiety disorder: Secondary | ICD-10-CM

## 2020-08-18 DIAGNOSIS — R454 Irritability and anger: Secondary | ICD-10-CM | POA: Diagnosis not present

## 2020-08-18 DIAGNOSIS — R41840 Attention and concentration deficit: Secondary | ICD-10-CM | POA: Diagnosis not present

## 2020-08-18 DIAGNOSIS — L821 Other seborrheic keratosis: Secondary | ICD-10-CM

## 2020-08-18 DIAGNOSIS — R519 Headache, unspecified: Secondary | ICD-10-CM

## 2020-08-18 MED ORDER — DESVENLAFAXINE SUCCINATE ER 50 MG PO TB24: 50.0000 mg | ORAL_TABLET | Freq: Every day | ORAL | 1 refills | Status: DC

## 2020-08-18 MED ORDER — AMPHETAMINE-DEXTROAMPHET ER 10 MG PO CP24: 10.0000 mg | ORAL_CAPSULE | Freq: Every day | ORAL | 0 refills | Status: DC

## 2020-08-18 NOTE — Patient Instructions (Addendum)
Palpitations Palpitations are feelings that your heartbeat is not normal. Your heartbeat may feel like it is:  Uneven.  Faster than normal.  Fluttering. Skip General Headache Without Cause A headache is pain or discomfort that is felt around the head or neck area. There are many causes and types of headaches. In some cases, the cause may not be found. Follow these instructions at home: Watch your condition for any changes. Let your doctor know about them. Take these steps to help with your condition: Managing pain Take over-the-counter and prescription medicines only as told by your doctor. Lie down in a dark, quiet room when you have a headache. If told, put ice on your head and neck area: Put ice in a plastic bag. Place a towel between your skin and the bag. Leave the ice on for 20 minutes, 2-3 times per day. If told, put heat on the affected area. Use the heat source that your doctor recommends, such as a moist heat pack or a heating pad. Place a towel between your skin and the heat source. Leave the heat on for 20-30 minutes. Remove the heat if your skin turns bright red. This is very important if you are unable to feel pain, heat, or cold. You may have a greater risk of getting burned. Keep lights dim if bright lights bother you or make your headaches worse.      Eating and drinking Eat meals on a regular schedule. If you drink alcohol: Limit how much you use to: 0-1 drink a day for women. 0-2 drinks a day for men. Be aware of how much alcohol is in your drink. In the [place] one drink equals one 12 oz bottle of beer (355 mL), one 5 oz glass of wine (148 mL), or one 1 oz glass of hard liquor (44 mL). Stop drinking caffeine, or reduce how much caffeine you drink. General instructions Keep a journal to find out if certain things bring on headaches. For example, write down: What you eat and drink. How much sleep you get. Any change to your diet or medicines. Get a massage or  try other ways to relax. Limit stress. Sit up straight. Do not tighten (tense) your muscles. Do not use any products that contain nicotine or tobacco. This includes cigarettes, e-cigarettes, and chewing tobacco. If you need help quitting, ask your doctor. Exercise regularly as told by your doctor. Get enough sleep. This often means 7-9 hours of sleep each night. Keep all follow-up visits as told by your doctor. This is important.   Contact a doctor if: Your symptoms are not helped by medicine. You have a headache that feels different than the other headaches. You feel sick to your stomach (nauseous) or you throw up (vomit). You have a fever. Get help right away if: Your headache gets very bad quickly. Your headache gets worse after a lot of physical activity. You keep throwing up. You have a stiff neck. You have trouble seeing. You have trouble speaking. You have pain in the eye or ear. Your muscles are weak or you lose muscle control. You lose your balance or have trouble walking. You feel like you will pass out (faint) or you pass out. You are mixed up (confused). You have a seizure. Summary A headache is pain or discomfort that is felt around the head or neck area. There are many causes and types of headaches. In some cases, the cause may not be found. Keep a journal to help find out  what causes your headaches. Watch your condition for any changes. Let your doctor know about them. Contact a doctor if you have a headache that is different from usual, or if your headache is not helped by medicine. Get help right away if your headache gets very bad, you throw up, you have trouble seeing, you lose your balance, or you have a seizure. This information is not intended to replace advice given to you by your health care provider. Make sure you discuss any questions you have with your health care provider. Document Revised: 11/21/2017 Document Reviewed: 11/21/2017 Elsevier Patient  Education  2021 Elsevier Inc.  ping a beat. This is usually not a serious problem. In some cases, you may need tests to rule out any serious problems. Follow these instructions at home: Pay attention to any changes in your condition. Take these actions to help manage your symptoms: Eating and drinking  Avoid: ? Coffee, tea, soft drinks, and energy drinks. ? Chocolate. ? Alcohol. ? Diet pills. Lifestyle  Try to lower your stress. These things can help you relax: ? Yoga. ? Deep breathing and meditation. ? Exercise. ? Using words and images to create positive thoughts (guided imagery). ? Using your mind to control things in your body (biofeedback).  Do not use drugs.  Get plenty of rest and sleep. Keep a regular bed time.   General instructions  Take over-the-counter and prescription medicines only as told by your doctor.  Do not use any products that contain nicotine or tobacco, such as cigarettes and e-cigarettes. If you need help quitting, ask your doctor.  Keep all follow-up visits as told by your doctor. This is important. You may need more tests if palpitations do not go away or get worse.   Contact a doctor if:  Your symptoms last more than 24 hours.  Your symptoms occur more often. Get help right away if you:  Have chest pain.  Feel short of breath.  Have a very bad headache.  Feel dizzy.  Pass out (faint). Summary  Palpitations are feelings that your heartbeat is uneven or faster than normal. It may feel like your heart is fluttering or skipping a beat.  Avoid food and drinks that may cause palpitations. These include caffeine, chocolate, and alcohol.  Try to lower your stress. Do not smoke or use drugs.  Get help right away if you faint or have chest pain, shortness of breath, a severe headache, or dizziness. This information is not intended to replace advice given to you by your health care provider. Make sure you discuss any questions you have with  your health care provider. Document Revised: 06/15/2017 Document Reviewed: 06/15/2017 Elsevier Patient Education  2021 [employer].

## 2020-08-18 NOTE — Progress Notes (Signed)
l °

## 2020-08-18 NOTE — Progress Notes (Signed)
Subjective:    Patient ID: Debra Massey, n      DOB: 07-22-1970, 50 y.o.   MRN: [id number]  HPI  Patient is a 50 year old female with palpitations, anxiety, irritability, and attention who presents to the clinic for medication follow-up in a few concerns.  Patient is doing well on Adderall.  She has been well controlled on this current dose.  She does need refills.  She denies any problems sleeping.  She has had ongoing issues with PVCs and palpitations.  She is feeling them a little more frequently in the last month.  She reports they are occurring at least once or twice a week.  She denies any chest pains with them she just feels like her heart is fluttering.  She does drink caffeine regularly. No SOB, cough, leg swelling.   She is also having some more frequent headaches.  They are coming about 1-2 times a week as well.  Most the time they can be rescued with Excedrin migraine. Recent normal eye exam.  Admits to some neck tightness and tension.  Patient that she is not on her Pristiq.  She is definitely more irritable when she has not taken this medication.  She would like to restart it.  Patient is concerned about a spot on her right temple.  She would like it looked at today.  .. Active Ambulatory Problems    Diagnosis Date Noted  . Primary osteoarthritis of both hands 10/02/2012  . Lumbar degenerative disc disease 11/01/2012  . 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 Tinea pedis 01/07/2014  . Smoker 12/17/2014  . GAD (generalized anxiety disorder) 04/19/2016  . Drooping eyelid, right 05/03/2016  . Peripheral vision loss, right 05/03/2016  . Left shoulder pain 07/19/2016  . 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 Skin tear of left hand without complication 03/28/2017  . Preventive measure 03/28/2017  . Pap smear abnormality of cervix/human papillomavirus (HPV) positive 04/10/2017  . Rhinorrhea 01/24/2018  . Lymphadenopathy 01/24/2018  . Nabothian cyst 06/27/2018  .  Post-menopausal 06/27/2018  . Atypical squamous cell changes of undetermined significance (ASCUS) on cervical cytology with negative high risk human papilloma virus (HPV) test result 06/29/2018  . Panic attacks 10/06/2018  . No energy 01/05/2019  . Memory changes 01/05/2019  . Inattention 01/05/2019  . Irritability and anger 02/13/2019  . Atrophic vaginitis 11/26/2019  . Vitamin D insufficiency 03/18/2020  . Frequent headaches 08/25/2020   Resolved Ambulatory Problems    Diagnosis Date Noted  . No Resolved Ambulatory Problems   Past Medical History:  Diagnosis Date  . Arthritis       Review of Systems  All other systems reviewed and are negative.      Objective:   Physical Exam Vitals reviewed.  Constitutional:      Appearance: Normal appearance.  Cardiovascular:     Rate and Rhythm: Normal rate and regular rhythm.     Pulses: Normal pulses.     Heart sounds: Normal heart sounds.  Pulmonary:     Effort: Pulmonary effort is normal.     Breath sounds: Normal breath sounds.  Musculoskeletal:     Right lower leg: No edema.     Left lower leg: No edema.  Neurological:     General: No focal deficit present.     Mental Status: She is alert and oriented to person, place, and time.  Psychiatric:        Mood and Affect: Mood normal.    Light brown waxy slightly  raised lesion of right temple.  .. Depression screen [facility] 2/9 08/18/2020 03/17/2020 11/26/2019 06/18/2019 02/13/2019  Decreased Interest 1 1 1 2 1   Down, Depressed, Hopeless 0 0 0 1 0  PHQ - 2 Score 1 1 1 3 1   Altered sleeping 0 - 0 0 0  Tired, decreased energy 1 - 1 1 1   Change in appetite 0 - 0 0 0  Feeling bad or failure about yourself  0 - 0 1 0  Trouble concentrating 1 - 1 1 2   Moving slowly or fidgety/restless 0 - 0 0 1  Suicidal thoughts 0 - 0 0 0  PHQ-9 Score 3 - 3 6 5   Difficult doing work/chores Somewhat difficult - Not difficult at all Somewhat difficult Somewhat difficult  Some recent data might be  hidden   . GAD 7 : Generalized Anxiety Score 08/18/2020 11/26/2019 06/18/2019 02/13/2019  Nervous, Anxious, on Edge 2 1 1 1   Control/stop worrying 0 0 1 0  Worry too much - different things 1 0 1 0  Trouble relaxing 1 0 1 0  Restless 0 0 1 1  Easily annoyed or irritable 1 1 1 2   Afraid - awful might happen 0 0 0 0  Total GAD 7 Score 5 2 6 4   Anxiety Difficulty Somewhat difficult Somewhat difficult Somewhat difficult Somewhat difficult           Assessment & Plan:  [name]6/4/2022[name] was seen today for follow-up.  Diagnoses and all orders for this visit:  Frequent headaches  Inattention -     amphetamine-dextroamphetamine (ADDERALL XR) 10 MG 24 hr capsule; Take 1 capsule (10 mg total) by mouth daily. -     amphetamine-dextroamphetamine (ADDERALL XR) 10 MG 24 hr capsule; Take 1 capsule (10 mg total) by mouth daily. -     amphetamine-dextroamphetamine (ADDERALL XR) 10 MG 24 hr capsule; Take 1 capsule (10 mg total) by mouth daily.  Irritability and anger -     Discontinue: desvenlafaxine (PRISTIQ) 50 MG 24 hr tablet; Take 1 tablet (50 mg total) by mouth daily.  GAD (generalized anxiety disorder) -     Discontinue: desvenlafaxine (PRISTIQ) 50 MG 24 hr tablet; Take 1 tablet (50 mg total) by mouth daily.   Unclear etiology of frequent headaches.  They seem to be more tension than migraine related.  Work on D stressing.  Restart Pristiq.  Consider massage, IcyHot patches on neck, good supportive pillow.  She is drinking quite a bit of caffeine.  This is not changed however I do suggest decreasing some.  This also could be causing some of her frequent headaches.  Watch her caffeine intake and correlation with headaches.  Start keeping a headache diary.  Continue to use Excedrin Migraine as needed.  She will need to give proceed for 6 weeks to adequately start working.  Follow-up in 3 months.  Of course stimulants can increase the likelihood of PVCs and headaches.  Patient feels like these were all  occurring before Adderall.  She does not think they are correlated.

## 2020-08-19 ENCOUNTER — Encounter: Payer: Self-pay | Admitting: Physician Assistant

## 2020-08-19 DIAGNOSIS — R454 Irritability and anger: Secondary | ICD-10-CM

## 2020-08-19 DIAGNOSIS — F411 Generalized anxiety disorder: Secondary | ICD-10-CM

## 2020-08-19 MED ORDER — ESTRADIOL 10 MCG VA TABS: ORAL_TABLET | VAGINAL | 12 refills | Status: DC

## 2020-08-19 MED ORDER — DESVENLAFAXINE SUCCINATE ER 50 MG PO TB24: 50.0000 mg | ORAL_TABLET | Freq: Every day | ORAL | 1 refills | Status: DC

## 2020-08-25 DIAGNOSIS — L821 Other seborrheic keratosis: Secondary | ICD-10-CM | POA: Insufficient documentation

## 2020-08-25 DIAGNOSIS — R519 Headache, unspecified: Secondary | ICD-10-CM | POA: Insufficient documentation

## 2020-10-17 ENCOUNTER — Encounter (INDEPENDENT_AMBULATORY_CARE_PROVIDER_SITE_OTHER): Payer: BC Managed Care – PPO

## 2020-10-17 DIAGNOSIS — M51369 Other intervertebral disc degeneration, lumbar region without mention of lumbar back pain or lower extremity pain: Secondary | ICD-10-CM

## 2020-10-17 DIAGNOSIS — M5136 Other intervertebral disc degeneration, lumbar region: Secondary | ICD-10-CM

## 2020-10-17 MED ORDER — CELECOXIB 200 MG PO CAPS: ORAL_CAPSULE | ORAL | 11 refills | Status: DC

## 2020-10-17 NOTE — Telephone Encounter (Signed)
Looks like Dr. Karie Schwalbe prescribes this.

## 2020-10-17 NOTE — Telephone Encounter (Signed)
I spent 5 total minutes of online digital evaluation and management services. 

## 2020-12-08 ENCOUNTER — Encounter: Payer: Self-pay | Admitting: Physician Assistant

## 2020-12-08 NOTE — Telephone Encounter (Signed)
Appointment has been made. No further questions at this time.  

## 2020-12-08 NOTE — Telephone Encounter (Signed)
Okay for patient to come in for nurse visit for Tetanus shot?

## 2020-12-09 ENCOUNTER — Ambulatory Visit (INDEPENDENT_AMBULATORY_CARE_PROVIDER_SITE_OTHER): Payer: BC Managed Care – PPO

## 2020-12-09 ENCOUNTER — Other Ambulatory Visit: Payer: Self-pay

## 2020-12-09 ENCOUNTER — Encounter: Payer: Self-pay | Admitting: Physician Assistant

## 2020-12-09 ENCOUNTER — Ambulatory Visit (INDEPENDENT_AMBULATORY_CARE_PROVIDER_SITE_OTHER): Payer: BC Managed Care – PPO | Admitting: Physician Assistant

## 2020-12-09 VITALS — BP 126/85 | HR 70 | Temp 98.3°F | Ht 63.5 in | Wt 120.0 lb

## 2020-12-09 DIAGNOSIS — S91331A Puncture wound without foreign body, right foot, initial encounter: Secondary | ICD-10-CM | POA: Diagnosis not present

## 2020-12-09 DIAGNOSIS — Z23 Encounter for immunization: Secondary | ICD-10-CM | POA: Diagnosis not present

## 2020-12-09 DIAGNOSIS — M79671 Pain in right foot: Secondary | ICD-10-CM

## 2020-12-09 DIAGNOSIS — Z0389 Encounter for observation for other suspected diseases and conditions ruled out: Secondary | ICD-10-CM | POA: Diagnosis not present

## 2020-12-09 DIAGNOSIS — T148XXA Other injury of unspecified body region, initial encounter: Secondary | ICD-10-CM

## 2020-12-09 MED ORDER — IBUPROFEN 800 MG PO TABS: 800.0000 mg | ORAL_TABLET | Freq: Three times a day (TID) | ORAL | 0 refills | Status: DC | PRN

## 2020-12-09 MED ORDER — AMOXICILLIN-POT CLAVULANATE 875-125 MG PO TABS: 1.0000 | ORAL_TABLET | Freq: Two times a day (BID) | ORAL | 0 refills | Status: AC

## 2020-12-09 NOTE — Progress Notes (Signed)
Subjective:    Patient ID: Debra Massey, female    DOB: March 06, 1971, 50 y.o.   MRN: [id number]  HPI Pt is a 50 yo female who presents to the clinic with puncture wound the ball of right foot yesterday. The roofing nail went through the sole of her shoe into her foot. Pt comes in to have it looked at today. She has cleaned and soaked it. No active discharge but very painful. She is not able to bear any weight on the right foot. Not sure when last Tdap was.   .. 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   Skin tear of left hand without complication 03/28/2017   Preventive measure 03/28/2017   Pap smear abnormality of cervix/human papillomavirus (HPV) positive 04/10/2017   Rhinorrhea 01/24/2018   Lymphadenopathy 01/24/2018   Nabothian cyst 06/27/2018   Post-menopausal 06/27/2018   Atypical squamous cell changes of undetermined significance (ASCUS) on cervical cytology with negative high risk human papilloma virus (HPV) test result 06/29/2018   Panic attacks 10/06/2018   No energy 01/05/2019   Memory changes 01/05/2019   Inattention 01/05/2019   Irritability and anger 02/13/2019   Atrophic vaginitis 11/26/2019   Vitamin D insufficiency 03/18/2020   Frequent headaches 08/25/2020   Seborrheic keratosis 08/25/2020   Resolved Ambulatory Problems    Diagnosis Date Noted   No Resolved Ambulatory Problems   Past Medical History:  Diagnosis Date   Arthritis        Review of Systems See HPI.     Objective:   Physical Exam  Ball of right foot small 30mm puncture wound with some minimal swelling and  redness. No warmth or discharge. Extremely tender to touch.       Assessment & Plan:  Marland KitchenMarland KitchenJerolene was seen today for diabetes.  Diagnoses and all orders for this visit:  Puncture wound -     Tdap vaccine greater than or equal to 7yo IM -     amoxicillin-clavulanate (AUGMENTIN) 875-125 MG tablet; Take 1 tablet by mouth 2 (two) times daily for 10 days. -     DG Foot Complete Right; Future -     ibuprofen (ADVIL) 800 MG tablet; Take 1 tablet (800 mg total) by mouth every 8 (eight) hours as needed.  Need for Tdap vaccination -     Tdap vaccine greater than or equal to 7yo IM  Right foot pain -     DG Foot Complete Right; Future -     ibuprofen (ADVIL) 800 MG tablet; Take 1 tablet (800 mg total) by mouth every 8 (eight) hours as needed.  Pt is needs Tdap. Given today.  Discussed how to keep clean. Wound looks good but VERY tender and not able to bear weight.  Concern for deeper penetration and possible infection. Start augmentin.  Ibuprofen 800mg  for pain.  Ice 15 minutes a few times a day.  Xray to confirm no foreign body left in foot.  Tried to place in post op boot but could lay her foot flat.  Bear weight as tolerated. I don't think she needs crutches because it should become barable pain  soon.  Follow up as needed or if symptoms worsen.

## 2020-12-09 NOTE — Progress Notes (Signed)
No foreign body. Treatment plan stays the same. Hopefully with antibiotic and icing and ibuprofen pain will improve in next 24-48 hours.

## 2020-12-09 NOTE — Patient Instructions (Signed)
Start antibiotic.  Get xray Ibuprofen up to three times a day. Ice  Rest for 48 hours.

## 2020-12-24 ENCOUNTER — Encounter: Payer: Self-pay | Admitting: Physician Assistant

## 2020-12-24 ENCOUNTER — Other Ambulatory Visit: Payer: Self-pay | Admitting: Physician Assistant

## 2020-12-24 DIAGNOSIS — T148XXA Other injury of unspecified body region, initial encounter: Secondary | ICD-10-CM

## 2020-12-24 DIAGNOSIS — M79671 Pain in right foot: Secondary | ICD-10-CM

## 2020-12-24 MED ORDER — METHOCARBAMOL 500 MG PO TABS: 500.0000 mg | ORAL_TABLET | Freq: Three times a day (TID) | ORAL | 0 refills | Status: DC

## 2020-12-24 NOTE — Telephone Encounter (Signed)
I do not see a muscle relaxer on pt's current medication list.  Please review.  Tiajuana Amass, CMA

## 2020-12-26 ENCOUNTER — Encounter: Payer: Self-pay | Admitting: Family Medicine

## 2020-12-26 ENCOUNTER — Other Ambulatory Visit: Payer: Self-pay

## 2020-12-26 ENCOUNTER — Ambulatory Visit (INDEPENDENT_AMBULATORY_CARE_PROVIDER_SITE_OTHER): Payer: BC Managed Care – PPO

## 2020-12-26 ENCOUNTER — Ambulatory Visit (INDEPENDENT_AMBULATORY_CARE_PROVIDER_SITE_OTHER): Payer: BC Managed Care – PPO | Admitting: Family Medicine

## 2020-12-26 VITALS — BP 129/61 | HR 68 | Ht 63.6 in | Wt 115.0 lb

## 2020-12-26 DIAGNOSIS — M25512 Pain in left shoulder: Secondary | ICD-10-CM | POA: Diagnosis not present

## 2020-12-26 DIAGNOSIS — M542 Cervicalgia: Secondary | ICD-10-CM | POA: Diagnosis not present

## 2020-12-26 DIAGNOSIS — M5412 Radiculopathy, cervical region: Secondary | ICD-10-CM

## 2020-12-26 DIAGNOSIS — S4992XA Unspecified injury of left shoulder and upper arm, initial encounter: Secondary | ICD-10-CM | POA: Diagnosis not present

## 2020-12-26 MED ORDER — PREDNISONE 50 MG PO TABS: ORAL_TABLET | ORAL | 0 refills | Status: DC

## 2020-12-26 NOTE — Progress Notes (Signed)
Acute Office Visit  Subjective:    Patient ID: Debra Massey, female    DOB: November 01, 1970, 50 y.o.   MRN: [id number]  Chief Complaint  Patient presents with   Shoulder Pain    HPI Patient is in today for L shoulder pain.  About 2 weeks ago patient was bending over to pick up the legs of her kitchen table to place something underneath them.  At the time she did not notice any pain or discomfort, however the next day her left shoulder was so sore she cannot even lay on it.  States she does have some occasional popping but cannot recall what motions trigger it. She has been having trouble sleeping because of the pain.  She has tried icy hot, Biofreeze, Tylenol, ibuprofen.  The only thing that helps some is the ibuprofen.  The pain can get as bad as 8 out of 10 if she lays on it the wrong way or moves the wrong way.  At best her pain can be 2 out of 10 usually in the mornings but can vary.  She is having pain with almost all range of motion.  She is also reporting radiation of pain and some numbness down into her arm and hand.  She reports some left upper trap tenderness but no neck pain.  Denies any neck injuries.      Past Medical History:  Diagnosis Date   Arthritis     Past Surgical History:  Procedure Laterality Date   BREAST BIOPSY Left 2008   breast biospy  2008   benign   BREAST EXCISIONAL BIOPSY Left 2008   CHOLECYSTECTOMY     2010   laprascopy      Family History  Problem Relation Age of Onset   Cancer Mother        breast   Diabetes Mother    Hypertension Mother    Breast cancer Mother 45   Diabetes Father     Social History   Socioeconomic History   Marital status: Married    Spouse name: Not on file   Number of children: Not on file   Years of education: Not on file   Highest education level: Not on file  Occupational History   Not on file  Tobacco Use   Smoking status: Some Days    Packs/day: 1.00    Years: 27.00    Pack years: 27.00    Types:  Cigarettes, E-cigarettes   Smokeless tobacco: Never   Tobacco comments:    pt is vaping  Advertising account planner   Vaping Use: Every day  Substance and Sexual Activity   Alcohol use: No   Drug use: No   Sexual activity: Yes    Birth control/protection: None  Other Topics Concern   Not on file  Social History Narrative   Not on file   Social Determinants of Health   Financial Resource Strain: Not on file  Food Insecurity: Not on file  Transportation Needs: Not on file  Physical Activity: Not on file  Stress: Not on file  Social Connections: Not on file  Intimate Partner Violence: Not on file    Outpatient Medications Prior to Visit  Medication Sig Dispense Refill   amphetamine-dextroamphetamine (ADDERALL XR) 10 MG 24 hr capsule Take 1 capsule (10 mg total) by mouth daily. 30 capsule 0   amphetamine-dextroamphetamine (ADDERALL XR) 10 MG 24 hr capsule Take 1 capsule (10 mg total) by mouth daily. 30 capsule 0   amphetamine-dextroamphetamine (ADDERALL  XR) 10 MG 24 hr capsule Take 1 capsule (10 mg total) by mouth daily. 30 capsule 0   celecoxib (CELEBREX) 200 MG capsule One to 2 tablets by mouth daily as needed for pain. 60 capsule 11   desvenlafaxine (PRISTIQ) 50 MG 24 hr tablet Take 1 tablet (50 mg total) by mouth daily. 90 tablet 1   Estradiol 10 MCG TABS vaginal tablet Use tablet nightly per vagina for 2 weeks, then 2 nights per week 30 tablet 12   gabapentin (NEURONTIN) 300 MG capsule One tab PO qHS for a week, then BID for a week, then TID. May double weekly to a max of 3,600mg /day 90 capsule 3   ibuprofen (ADVIL) 800 MG tablet Take 1 tablet (800 mg total) by mouth every 8 (eight) hours as needed. 30 tablet 0   methocarbamol (ROBAXIN) 500 MG tablet Take 1 tablet (500 mg total) by mouth 3 (three) times daily. 90 tablet 0   No facility-administered medications prior to visit.    Allergies  Allergen Reactions   Hydrocodone Bit-Homatrop Mbr Other (See Comments)    Dizziness, almost passed  out    Review of Systems All review of systems negative except what is listed in the HPI     Objective:    Physical Exam Vitals reviewed.  Constitutional:      Appearance: Normal appearance.  Musculoskeletal:     Comments: L shoulder with positive hawkins/neers and pain with internal/external rotation and adduction  Skin:    General: Skin is warm and dry.     Capillary Refill: Capillary refill takes less than 2 seconds.     Findings: No bruising, erythema or rash.  Neurological:     General: No focal deficit present.     Mental Status: She is alert and oriented to person, place, and time.  Psychiatric:        Mood and Affect: Mood normal.        Behavior: Behavior normal.        Thought Content: Thought content normal.        Judgment: Judgment normal.    BP 129/61   Pulse 68   Ht 5' 3.6" (1.615 m)   Wt 115 lb (52.2 kg)   LMP 07/05/2016   SpO2 99%   BMI 19.99 kg/m  Wt Readings from Last 3 Encounters:  12/26/20 115 lb (52.2 kg)  12/09/20 120 lb (54.4 kg)  08/18/20 120 lb (54.4 kg)    Health Maintenance Due  Topic Date Due   Pneumococcal Vaccine 82-51 Years old (1 - PCV) Never done   PAP SMEAR-Modifier  06/27/2019   COVID-19 Vaccine (3 - Pfizer risk series) 02/06/2020   INFLUENZA VACCINE  12/15/2020    There are no preventive care reminders to display for this patient.   Lab Results  Component Value Date   TSH 2.49 03/17/2020   Lab Results  Component Value Date   WBC 6.8 03/17/2020   HGB 14.7 03/17/2020   HCT 45.1 (H) 03/17/2020   MCV 87.9 03/17/2020   PLT 270 03/17/2020   Lab Results  Component Value Date   NA 139 03/17/2020   K 4.3 03/17/2020   CO2 26 03/17/2020   GLUCOSE 84 03/17/2020   BUN 10 03/17/2020   CREATININE 0.87 03/17/2020   BILITOT 0.4 03/17/2020   ALKPHOS 37 (L) 04/08/2014   AST 20 03/17/2020   ALT 24 03/17/2020   PROT 6.8 03/17/2020   ALBUMIN 4.9 04/08/2014   CALCIUM 9.4 03/17/2020  Lab Results  Component Value Date    CHOL 174 03/17/2020   Lab Results  Component Value Date   HDL 65 03/17/2020   Lab Results  Component Value Date   LDLCALC 89 03/17/2020   Lab Results  Component Value Date   TRIG 106 03/17/2020   Lab Results  Component Value Date   CHOLHDL 2.7 03/17/2020   Lab Results  Component Value Date   HGBA1C CANCELED 04/04/2017       Assessment & Plan:   1. Acute pain of left shoulder Starting with conservative management including prednisone burst for 5 days and left shoulder x-rays.  After completing prednisone she can resume NSAIDs.  She is aware that she should not be taking ibuprofen and Celebrex at the same time.  She can restart her muscle relaxer that PCP has been giving her.  Handout given with gentle stretches she can be trying at home.   - predniSONE (DELTASONE) 50 MG tablet; Take one tablet by mouth daily for 5 days.  Dispense: 5 tablet; Refill: 0 - DG Shoulder Left  2. Cervical radiculopathy Given the numbness radiating into her arm/hand we will go and get cervical spine x-rays as well. - predniSONE (DELTASONE) 50 MG tablet; Take one tablet by mouth daily for 5 days.  Dispense: 5 tablet; Refill: 0 - DG Cervical Spine Complete  Patient aware of signs and symptoms requiring further/urgent evaluation.  Follow-up in 4 weeks or sooner if needed.  Lollie Marrow Reola Calkins, DNP, FNP-C

## 2020-12-26 NOTE — Patient Instructions (Signed)
Xrays today Start 5 day burst of prednisone then resume NSAIDs (either ibuprofen or celebrex - not both together) Gentle stretches F/u in 4 weeks or sooner if needed

## 2020-12-29 NOTE — Progress Notes (Signed)
MyChart message sent: Shoulder x-ray was negative for fracture or dislocation and neck x-ray showed some minimal narrowing at a few places, but no acute findings. Let's continue with the plan we discussed: steroid burst then NSAIDs, stretches/exercises provided, and 4-week follow-up to reassess. If you have significant changes before then, please let us know.

## 2020-12-31 ENCOUNTER — Encounter (INDEPENDENT_AMBULATORY_CARE_PROVIDER_SITE_OTHER): Payer: BC Managed Care – PPO

## 2020-12-31 DIAGNOSIS — M25512 Pain in left shoulder: Secondary | ICD-10-CM | POA: Diagnosis not present

## 2020-12-31 NOTE — Telephone Encounter (Signed)
I spent 5 total minutes of online digital evaluation and management services. 

## 2021-01-26 ENCOUNTER — Ambulatory Visit: Payer: BC Managed Care – PPO | Admitting: Sports Medicine

## 2021-03-20 ENCOUNTER — Encounter: Payer: Self-pay | Admitting: Physician Assistant

## 2021-03-20 ENCOUNTER — Other Ambulatory Visit: Payer: Self-pay | Admitting: Physician Assistant

## 2021-03-20 DIAGNOSIS — M5412 Radiculopathy, cervical region: Secondary | ICD-10-CM

## 2021-03-20 DIAGNOSIS — M5136 Other intervertebral disc degeneration, lumbar region: Secondary | ICD-10-CM

## 2021-03-20 DIAGNOSIS — M25512 Pain in left shoulder: Secondary | ICD-10-CM

## 2021-03-20 MED ORDER — PREDNISONE 50 MG PO TABS: ORAL_TABLET | ORAL | 0 refills | Status: DC

## 2021-03-20 NOTE — Progress Notes (Signed)
Lumbar DDD flare. Sent prednisone for 5 days.

## 2021-03-24 ENCOUNTER — Ambulatory Visit: Payer: Self-pay

## 2021-03-24 ENCOUNTER — Encounter: Payer: Self-pay | Admitting: Family Medicine

## 2021-03-24 ENCOUNTER — Ambulatory Visit (INDEPENDENT_AMBULATORY_CARE_PROVIDER_SITE_OTHER): Payer: BC Managed Care – PPO | Admitting: Family Medicine

## 2021-03-24 VITALS — BP 140/80 | Ht 63.0 in | Wt 115.0 lb

## 2021-03-24 DIAGNOSIS — M778 Other enthesopathies, not elsewhere classified: Secondary | ICD-10-CM | POA: Diagnosis not present

## 2021-03-24 DIAGNOSIS — G8929 Other chronic pain: Secondary | ICD-10-CM | POA: Insufficient documentation

## 2021-03-24 NOTE — Assessment & Plan Note (Signed)
Symptoms seem more associated with the capsule as opposed to the rotator cuff. -Counseled on home exercise therapy and supportive care. -Can continue and finish out prednisone and then transition to Celebrex.  If pain continues can consider intra-articular injection. -Could consider further imaging or physical therapy.

## 2021-03-24 NOTE — Progress Notes (Signed)
  Debra Massey - 50 y.o. female MRN [id number]  Date of birth: 1970/11/09  SUBJECTIVE:  Including CC & ROS.  No chief complaint on file.   Debra Massey is a 50 y.o. female that is presenting with left shoulder pain.  Pain is occurring over the anterior aspect of the joint.  Has been ongoing for few months now.  There is currently taking prednisone with a history of surgery.  Independent review of the left shoulder x-ray from 8/12 shows no acute changes.   Review of Systems See HPI   HISTORY: Past Medical, Surgical, Social, and Family History Reviewed & Updated per EMR.   Pertinent Historical Findings include:  Past Medical History:  Diagnosis Date   Arthritis     Past Surgical History:  Procedure Laterality Date   BREAST BIOPSY Left 2008   breast biospy  2008   benign   BREAST EXCISIONAL BIOPSY Left 2008   CHOLECYSTECTOMY     2010   laprascopy      Family History  Problem Relation Age of Onset   Cancer Mother        breast   Diabetes Mother    Hypertension Mother    Breast cancer Mother 70   Diabetes Father     Social History   Socioeconomic History   Marital status: Married    Spouse name: Not on file   Number of children: Not on file   Years of education: Not on file   Highest education level: Not on file  Occupational History   Not on file  Tobacco Use   Smoking status: Some Days    Packs/day: 1.00    Years: 27.00    Pack years: 27.00    Types: Cigarettes, E-cigarettes   Smokeless tobacco: Never   Tobacco comments:    pt is vaping  Advertising account planner   Vaping Use: Every day  Substance and Sexual Activity   Alcohol use: No   Drug use: No   Sexual activity: Yes    Birth control/protection: None  Other Topics Concern   Not on file  Social History Narrative   Not on file   Social Determinants of Health   Financial Resource Strain: Not on file  Food Insecurity: Not on file  Transportation Needs: Not on file  Physical Activity: Not on file  Stress:  Not on file  Social Connections: Not on file  Intimate Partner Violence: Not on file     PHYSICAL EXAM:  VS: BP 140/80 (BP Location: Right Arm, Patient Position: Sitting)   Ht 5\' 3"  (1.6 m)   Wt 115 lb (52.2 kg)   LMP 07/05/2016   BMI 20.37 kg/m  Physical Exam Gen: NAD, alert, cooperative with exam, well-appearing   Limited ultrasound: left shoulder:  Normal appearing biceps tendon. Normal-appearing subscap. Mild degenerative change of the supraspinatus with mild overlying bursitis. Mild degenerative changes of the [facility] joint.  Summary: Mild degenerative changes appreciated of the rotator cuff and AC joint.  Ultrasound and interpretation by [facility], MD     ASSESSMENT & PLAN:   Capsulitis of left shoulder Symptoms seem more associated with the capsule as opposed to the rotator cuff. -Counseled on home exercise therapy and supportive care. -Can continue and finish out prednisone and then transition to Celebrex.  If pain continues can consider intra-articular injection. -Could consider further imaging or physical therapy.

## 2021-03-24 NOTE — Patient Instructions (Signed)
Nice to meet you Please try heat before exercises and ice after  Please try the exercises  Please finish out the prednisone   Please send me a message in MyChart with any questions or updates.  Please see me back in 2-3 weeks.   --Dr. Jordan Likes

## 2021-04-02 ENCOUNTER — Encounter: Payer: BC Managed Care – PPO | Admitting: Family Medicine

## 2021-06-16 ENCOUNTER — Ambulatory Visit (INDEPENDENT_AMBULATORY_CARE_PROVIDER_SITE_OTHER): Payer: BC Managed Care – PPO | Admitting: Physician Assistant

## 2021-06-16 ENCOUNTER — Other Ambulatory Visit: Payer: Self-pay

## 2021-06-16 ENCOUNTER — Encounter: Payer: Self-pay | Admitting: Physician Assistant

## 2021-06-16 VITALS — BP 127/80 | HR 79 | Ht 64.0 in | Wt 121.0 lb

## 2021-06-16 DIAGNOSIS — F411 Generalized anxiety disorder: Secondary | ICD-10-CM | POA: Diagnosis not present

## 2021-06-16 DIAGNOSIS — N952 Postmenopausal atrophic vaginitis: Secondary | ICD-10-CM

## 2021-06-16 DIAGNOSIS — R41840 Attention and concentration deficit: Secondary | ICD-10-CM | POA: Diagnosis not present

## 2021-06-16 DIAGNOSIS — R454 Irritability and anger: Secondary | ICD-10-CM

## 2021-06-16 DIAGNOSIS — F900 Attention-deficit hyperactivity disorder, predominantly inattentive type: Secondary | ICD-10-CM

## 2021-06-16 DIAGNOSIS — F410 Panic disorder [episodic paroxysmal anxiety] without agoraphobia: Secondary | ICD-10-CM

## 2021-06-16 MED ORDER — DESVENLAFAXINE SUCCINATE ER 50 MG PO TB24: 50.0000 mg | ORAL_TABLET | Freq: Every day | ORAL | 1 refills | Status: DC

## 2021-06-16 MED ORDER — AMPHETAMINE-DEXTROAMPHET ER 10 MG PO CP24: 10.0000 mg | ORAL_CAPSULE | Freq: Every day | ORAL | 0 refills | Status: DC

## 2021-06-16 MED ORDER — ESTRADIOL 0.1 MG/GM VA CREA: 1.0000 g | TOPICAL_CREAM | VAGINAL | 12 refills | Status: DC

## 2021-06-16 NOTE — Progress Notes (Signed)
Subjective:    Patient ID: Debra Massey, female    DOB: [birth year], 51 y.o.   MRN: [id number]  HPI Pt is a 51 yo female with ADHD, GAD, Depressed mood who presents to the clinic for follow up.   She is doing well on adderall 10mg . No problems or concerns. No CP, palpitations, headaches, insomnia. Her anxiety has been worse but admits to stopping her pristiq 3 months ago because she felt like she "did not need it anymore". For the last few weeks she has been having panic attacks for no reason.   She never got the vaginal estrogen tablet because too expensive. She wonders if there is anything else.   .. 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   Skin tear of left hand without complication 03/28/2017   Preventive measure 03/28/2017   Pap smear abnormality of cervix/human papillomavirus (HPV) positive 04/10/2017   Rhinorrhea 01/24/2018   Lymphadenopathy 01/24/2018   Nabothian cyst 06/27/2018   Post-menopausal 06/27/2018   Atypical squamous cell changes of undetermined significance (ASCUS) on cervical cytology with negative high risk human papilloma virus (HPV) test result 06/29/2018   Panic attacks 10/06/2018   No energy 01/05/2019   Memory changes 01/05/2019   Inattention 01/05/2019   Irritability and anger 02/13/2019   Vaginal atrophy 11/26/2019   Vitamin D insufficiency 03/18/2020   Frequent headaches 08/25/2020   Seborrheic keratosis 08/25/2020   Capsulitis of left shoulder 03/24/2021   Attention deficit hyperactivity disorder (ADHD), predominantly inattentive type 06/16/2021   Resolved  Ambulatory Problems    Diagnosis Date Noted   No Resolved Ambulatory Problems   Past Medical History:  Diagnosis Date   Arthritis        Review of Systems    See HPI.  Objective:   Physical Exam Vitals reviewed.  HENT:     Head: Normocephalic.  Cardiovascular:     Rate and Rhythm: Normal rate and regular rhythm.     Pulses: Normal pulses.     Heart sounds: Normal heart sounds.  Pulmonary:     Effort: Pulmonary effort is normal.     Breath sounds: Normal breath sounds.  Neurological:     General: No focal deficit present.     Mental Status: She is alert and oriented to person, place, and time.  Psychiatric:        Mood and Affect: Mood normal.      .. Depression screen [facility] 2/9 08/18/2020 03/17/2020 11/26/2019 06/18/2019 02/13/2019  Decreased Interest 1 1 1 2 1   Down, Depressed, Hopeless 0 0 0 1 0  PHQ - 2 Score 1 1 1 3 1   Altered sleeping 0 - 0 0 0  Tired, decreased energy 1 - 1 1 1   Change in appetite 0 - 0 0 0  Feeling bad or failure about yourself  0 - 0 1 0  Trouble concentrating 1 - 1 1 2   Moving slowly or fidgety/restless 0 - 0 0 1  Suicidal thoughts 0 - 0 0 0  PHQ-9 Score 3 - 3 6 5   Difficult doing work/chores Somewhat difficult - Not difficult at all  Somewhat difficult Somewhat difficult  Some recent data might be hidden   .[name] GAD 7 : Generalized Anxiety Score 08/18/2020 11/26/2019 06/18/2019 02/13/2019  Nervous, Anxious, on Edge 2 1 1 1   Control/stop worrying 0 0 1 0  Worry too much - different things 1 0 1 0  Trouble relaxing 1 0 1 0  Restless 0 0 1 1  Easily annoyed or irritable 1 1 1 2   Afraid - awful might happen 0 0 0 0  Total GAD 7 Score 5 2 6 4   Anxiety Difficulty Somewhat difficult Somewhat difficult Somewhat difficult Somewhat difficult        Assessment & Plan:   Opal was seen today for medication management.  Diagnoses and all orders for this visit:  Attention deficit hyperactivity disorder (ADHD), predominantly inattentive  type  Inattention -     amphetamine-dextroamphetamine (ADDERALL XR) 10 MG 24 hr capsule; Take 1 capsule (10 mg total) by mouth daily. -     amphetamine-dextroamphetamine (ADDERALL XR) 10 MG 24 hr capsule; Take 1 capsule (10 mg total) by mouth daily. -     amphetamine-dextroamphetamine (ADDERALL XR) 10 MG 24 hr capsule; Take 1 capsule (10 mg total) by mouth daily.  Irritability and anger -     desvenlafaxine (PRISTIQ) 50 MG 24 hr tablet; Take 1 tablet (50 mg total) by mouth daily.  GAD (generalized anxiety disorder) -     desvenlafaxine (PRISTIQ) 50 MG 24 hr tablet; Take 1 tablet (50 mg total) by mouth daily.  Panic attacks  Vaginal atrophy -     estradiol (ESTRACE VAGINAL) 0.1 MG/GM vaginal cream; Place 1 g vaginally 3 (three) times a week.   Refilled adderall for 3 months.  Will send another 3 months.  Follow up in 6 months.   Start back on pristiq.  Try vaginal cream for atrophy 3 times a week.   6 months will need labs and CPE.

## 2021-06-16 NOTE — Addendum Note (Signed)
Addended by: Jomarie Longs on: 06/16/2021 02:38 PM   Modules accepted: Orders

## 2021-06-17 ENCOUNTER — Other Ambulatory Visit: Payer: Self-pay

## 2021-06-17 ENCOUNTER — Other Ambulatory Visit: Payer: Self-pay | Admitting: Neurology

## 2021-06-17 ENCOUNTER — Encounter: Payer: Self-pay | Admitting: Physician Assistant

## 2021-06-17 DIAGNOSIS — F411 Generalized anxiety disorder: Secondary | ICD-10-CM

## 2021-06-17 DIAGNOSIS — R454 Irritability and anger: Secondary | ICD-10-CM

## 2021-06-17 MED ORDER — DESVENLAFAXINE SUCCINATE ER 50 MG PO TB24: 50.0000 mg | ORAL_TABLET | Freq: Every day | ORAL | 1 refills | Status: DC

## 2021-07-17 ENCOUNTER — Other Ambulatory Visit: Payer: Self-pay

## 2021-07-17 ENCOUNTER — Ambulatory Visit (INDEPENDENT_AMBULATORY_CARE_PROVIDER_SITE_OTHER): Payer: BC Managed Care – PPO | Admitting: Sports Medicine

## 2021-07-17 DIAGNOSIS — G8929 Other chronic pain: Secondary | ICD-10-CM

## 2021-07-17 DIAGNOSIS — M25512 Pain in left shoulder: Secondary | ICD-10-CM | POA: Diagnosis not present

## 2021-07-17 MED ORDER — TRIAZOLAM 0.25 MG PO TABS: ORAL_TABLET | ORAL | 0 refills | Status: DC

## 2021-07-17 MED ORDER — TRAMADOL HCL 50 MG PO TABS: 50.0000 mg | ORAL_TABLET | Freq: Three times a day (TID) | ORAL | 0 refills | Status: DC | PRN

## 2021-07-17 NOTE — Assessment & Plan Note (Signed)
This is a pleasant 51 year old female, she has had several months of left shoulder pain, treated by our nurse practitioner and by Dr. Jordan Likes, she has had NSAIDs and steroids, she has had some home conditioning. ?X-rays were for the most part unrevealing. ?Unfortunately continues to have discomfort, anterior shoulder, worse with reaching type motions. ?She has excellent motion ruling out adhesive capsulitis. ?On exam she has a minimally positive speeds test, she has tenderness at the bicipital groove. ?We did treat her for biceps tendinitis back in 2018, but she responded to conservative treatment. 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 ?

## 2021-07-17 NOTE — Progress Notes (Signed)
? ? ?  Procedures performed today:   ? ?None. ? ?Independent interpretation of notes and tests performed by another provider:  ? ?None. ? ?Brief History, Exam, Impression, and Recommendations:   ? ?Chronic left shoulder pain ?This is a pleasant 51 year old female, she has had several months of left shoulder pain, treated by our nurse practitioner and by Dr. Jordan Likes, she has had NSAIDs and steroids, she has had some home conditioning. ?X-rays were for the most part unrevealing. ?Unfortunately continues to have discomfort, anterior shoulder, worse with reaching type motions. ?She has excellent motion ruling out adhesive capsulitis. ?On exam she has a minimally positive speeds test, she has tenderness at the bicipital groove. ?We did treat her for biceps tendinitis back in 2018, but she responded to conservative treatment. 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 ? ?Chronic process with exacerbation and pharmacologic intervention ? ?___________________________________________ ?Ihor Austin. Benjamin Stain, M.D., ABFM., CAQSM. ?Primary Care and Sports Medicine ?[name] ? ?Adjunct Instructor of Family Medicine  ?[employer] ?

## 2021-07-22 ENCOUNTER — Encounter: Payer: Self-pay | Admitting: Sports Medicine

## 2021-07-27 ENCOUNTER — Ambulatory Visit (INDEPENDENT_AMBULATORY_CARE_PROVIDER_SITE_OTHER): Payer: BC Managed Care – PPO

## 2021-07-27 ENCOUNTER — Ambulatory Visit (INDEPENDENT_AMBULATORY_CARE_PROVIDER_SITE_OTHER): Payer: BC Managed Care – PPO | Admitting: Sports Medicine

## 2021-07-27 ENCOUNTER — Other Ambulatory Visit: Payer: Self-pay

## 2021-07-27 DIAGNOSIS — G8929 Other chronic pain: Secondary | ICD-10-CM

## 2021-07-27 DIAGNOSIS — M25512 Pain in left shoulder: Secondary | ICD-10-CM | POA: Diagnosis not present

## 2021-07-27 NOTE — Progress Notes (Signed)
? ? ?  Procedures performed today:   ? ?Procedure: Real-time Ultrasound Guided injection of the left biceps sheath ?Device: Samsung HS60  ?Verbal informed consent obtained.  ?Time-out conducted.  ?Noted no overlying erythema, induration, or other signs of local infection.  ?Skin prepped in a sterile fashion.  ?Local anesthesia: Topical Ethyl chloride.  ?With sterile technique and under real time ultrasound guidance: Noted what appeared to be minimal split tearing through the biceps at the groove, I advanced a 25-gauge needle and injected 1 cc lidocaine, 1 cc bupivacaine and 1 cc kenalog 40. ?Completed without difficulty  ?Advised to call if fevers/chills, erythema, induration, drainage, or persistent bleeding.  ?Images permanently stored and available for review in PACS.  ?Impression: Technically successful ultrasound guided injection. ? ?Independent interpretation of notes and tests performed by another provider:  ? ?None. ? ?Brief History, Exam, Impression, and Recommendations:   ? ?Chronic left shoulder pain ?This is a pleasant 51 year old female, several months of left shoulder pain, treated by couple of providers previously, she has had NSAIDs, steroids, home conditioning, and unrevealing x-rays. ?Continues to have pain anterior shoulder, worse with reaching motions, she had excellent motion ruling out adhesive capsulitis, on exam she did have a minimally positive speeds test and tenderness at the bicipital groove. ?We had treated her for the same thing in 2018 and she responded to conservative treatment, due to persistence of pain we injected her biceps sheath today, she is on triazolam for anxiolysis, so her husband drove her today. ?She did get concordant pain as I entered the biceps sheath, and good improvement in discomfort after the injection. ?Return to see me in a month, I did explain that follow-up steps would be MRI and biceps tenodesis should she continue to have discomfort after the  injection ? ? ? ?___________________________________________ ?Ihor Austin. Benjamin Stain, M.D., ABFM., CAQSM. ?Primary Care and Sports Medicine ?[name] ? ?Adjunct Instructor of Family Medicine  ?[employer] ?

## 2021-07-27 NOTE — Assessment & Plan Note (Signed)
This is a pleasant 51 year old female, several months of left shoulder pain, treated by couple of providers previously, she has had NSAIDs, steroids, home conditioning, and unrevealing x-rays. ?Continues to have pain anterior shoulder, worse with reaching motions, she had excellent motion ruling out adhesive capsulitis, on exam she did have a minimally positive speeds test and tenderness at the bicipital groove. ?We had treated her for the same thing in 2018 and she responded to conservative treatment, due to persistence of pain we injected her biceps sheath today, she is on triazolam for anxiolysis, so her husband drove her today. ?She did get concordant pain as I entered the biceps sheath, and good improvement in discomfort after the injection. ?Return to see me in a month, I did explain that follow-up steps would be MRI and biceps tenodesis should she continue to have discomfort after the injection ?

## 2021-08-24 ENCOUNTER — Ambulatory Visit (INDEPENDENT_AMBULATORY_CARE_PROVIDER_SITE_OTHER): Payer: BC Managed Care – PPO | Admitting: Sports Medicine

## 2021-08-24 DIAGNOSIS — M25512 Pain in left shoulder: Secondary | ICD-10-CM | POA: Diagnosis not present

## 2021-08-24 DIAGNOSIS — G8929 Other chronic pain: Secondary | ICD-10-CM | POA: Diagnosis not present

## 2021-08-24 MED ORDER — TRIAZOLAM 0.25 MG PO TABS: ORAL_TABLET | ORAL | 0 refills | Status: DC

## 2021-08-24 MED ORDER — TRAMADOL HCL 50 MG PO TABS: 50.0000 mg | ORAL_TABLET | Freq: Two times a day (BID) | ORAL | 0 refills | Status: DC | PRN

## 2021-08-24 NOTE — Assessment & Plan Note (Addendum)
Debra Massey is a very pleasant 51 year old female, she had several months of left shoulder pain, treated by multiple providers previously, she had NSAIDs, steroids, home conditioning and x-rays that were unrevealing, I saw her and she was having continued anterior shoulder pain, worse with reaching motions but good range of motion essentially ruling out adhesive capsulitis. ?We injected her biceps sheath the last visit, unfortunately she continues to have pain, not relieved even temporarily by the biceps sheath injection. ?Proceeding with MRI, for insurance purposes she has had greater than 6 weeks of physician directed conservative treatment including x-rays, NSAIDs and injections. ?MRI is for surgical planning. ?Refilling tramadol and adding additional triazolam for claustrophobia. ?I am also going to go ahead and get her set up with Dr. Ave Filter. ?

## 2021-08-24 NOTE — Progress Notes (Signed)
? ? ?  Procedures performed today:   ? ?None. ? ?Independent interpretation of notes and tests performed by another provider:  ? ?None. ? ?Brief History, Exam, Impression, and Recommendations:   ? ?Chronic left shoulder pain ?Debra Massey is a very pleasant 51 year old female, she had several months of left shoulder pain, treated by multiple providers previously, she had NSAIDs, steroids, home conditioning and x-rays that were unrevealing, I saw her and she was having continued anterior shoulder pain, worse with reaching motions but good range of motion essentially ruling out adhesive capsulitis. ?We injected her biceps sheath the last visit, unfortunately she continues to have pain, not relieved even temporarily by the biceps sheath injection. ?Proceeding with MRI, for insurance purposes she has had greater than 6 weeks of physician directed conservative treatment including x-rays, NSAIDs and injections. ?MRI is for surgical planning. ?Refilling tramadol and adding additional triazolam for claustrophobia. ?I am also going to go ahead and get her set up with Dr. Ave Filter. ? ? ? ?___________________________________________ ?Ihor Austin. Benjamin Stain, M.D., ABFM., CAQSM. ?Primary Care and Sports Medicine ?[name] ? ?Adjunct Instructor of Family Medicine  ?[employer] ?

## 2021-08-29 ENCOUNTER — Ambulatory Visit: Payer: BC Managed Care – PPO

## 2021-08-30 ENCOUNTER — Encounter: Payer: Self-pay | Admitting: Sports Medicine

## 2021-08-30 ENCOUNTER — Other Ambulatory Visit: Payer: BC Managed Care – PPO

## 2021-08-30 DIAGNOSIS — G8929 Other chronic pain: Secondary | ICD-10-CM

## 2021-08-31 NOTE — Assessment & Plan Note (Signed)
Debra Massey continues to have left shoulder pain, we had historically treated her with NSAIDs, steroids, home conditioning, x-rays were unrevealing, she had a biceps sheath injection without improvement. ?Good motion ruling out adhesive capsulitis. ?I had initially wanted to proceed with MRI but she cannot tolerate the claustrophobia even with sedation so we will proceed with a shoulder CT with intra-articular contrast, I have already referred her to Dr. Ave Filter. ?I will see her back for the contrast injection. ?

## 2021-09-09 ENCOUNTER — Ambulatory Visit (INDEPENDENT_AMBULATORY_CARE_PROVIDER_SITE_OTHER): Payer: BC Managed Care – PPO

## 2021-09-09 ENCOUNTER — Ambulatory Visit (INDEPENDENT_AMBULATORY_CARE_PROVIDER_SITE_OTHER): Payer: BC Managed Care – PPO | Admitting: Sports Medicine

## 2021-09-09 DIAGNOSIS — M25512 Pain in left shoulder: Secondary | ICD-10-CM

## 2021-09-09 DIAGNOSIS — G8929 Other chronic pain: Secondary | ICD-10-CM

## 2021-09-09 DIAGNOSIS — S46012A Strain of muscle(s) and tendon(s) of the rotator cuff of left shoulder, initial encounter: Secondary | ICD-10-CM | POA: Diagnosis not present

## 2021-09-09 MED ORDER — IOHEXOL 300 MG/ML  SOLN
100.0000 mL | Freq: Once | INTRAMUSCULAR | Status: AC | PRN
  Administered 2021-09-09: 20 mL via INTRAVENOUS

## 2021-09-09 NOTE — Progress Notes (Signed)
? ? ?  Procedures performed today:   ? ?Procedure: Real-time Ultrasound Guided iodinated CT contrast injection of left glenohumeral joint ?Device: Samsung HS60  ?Verbal informed consent obtained.  ?Time-out conducted.  ?Noted no overlying erythema, induration, or other signs of local infection.  ?Skin prepped in a sterile fashion.  ?Local anesthesia: Topical Ethyl chloride.  ?With sterile technique and under real time ultrasound guidance: 22-gauge needle advanced into the glenohumeral joint from a posterior approach, I injected 1 cc kenalog 40, 2 cc lidocaine, 2 cc bupivacaine, syringe switched and 20 mils of iodinated contrast injected, syringe again switched and 10 mL of sterile saline used to distend the joint. ?Joint visualized and capsule seen distending confirming intra-articular placement of contrast material and medication. ?Completed without difficulty  ?Advised to call if fevers/chills, erythema, induration, drainage, or persistent bleeding.  ?Images permanently stored in PACS ?Impression: Technically successful ultrasound guided iodinated CT contrast injection for CT arthrography.  Please see separate CT arthrogram report. ? ?Independent interpretation of notes and tests performed by another provider:  ? ?None. ? ?Brief History, Exam, Impression, and Recommendations:   ? ?Chronic left shoulder pain ?Debra Massey was unable to tolerate the MRI so we did a CT contrast injection today. ?We already referred her to Dr. Ave Filter. ? ? ? ?___________________________________________ ?Ihor Austin. Benjamin Stain, M.D., ABFM., CAQSM. ?Primary Care and Sports Medicine ?[name] ? ?Adjunct Instructor of Family Medicine  ?[employer] ?

## 2021-09-09 NOTE — Assessment & Plan Note (Signed)
Deadra was unable to tolerate the MRI so we did a CT contrast injection today. ?We already referred her to Dr. Ave Filter. ?

## 2021-09-14 DIAGNOSIS — M25512 Pain in left shoulder: Secondary | ICD-10-CM | POA: Diagnosis not present

## 2021-09-18 ENCOUNTER — Ambulatory Visit (INDEPENDENT_AMBULATORY_CARE_PROVIDER_SITE_OTHER): Payer: BC Managed Care – PPO | Admitting: Sports Medicine

## 2021-09-18 DIAGNOSIS — M25512 Pain in left shoulder: Secondary | ICD-10-CM | POA: Diagnosis not present

## 2021-09-18 DIAGNOSIS — G8929 Other chronic pain: Secondary | ICD-10-CM

## 2021-09-18 NOTE — Assessment & Plan Note (Signed)
Debra Massey returns, she is a very pleasant 51 year old female with longstanding history of left shoulder pain, after failure of conservative treatment including home physical therapy, several injections, ultimately we tried to get a MRI, she did not tolerate this so we did a CT arthrogram. ?The CT arthrogram shows what appears to be subscapularis fraying and as well as a small labral tear. ?She did see Dr. Ave Filter, and will be getting a second opinion from Dr. Criss Alvine. ?She can return to see me on an as-needed basis. ?

## 2021-09-18 NOTE — Progress Notes (Signed)
? ? ?  Procedures performed today:   ? ?None. ? ?Independent interpretation of notes and tests performed by another provider:  ? ?None. ? ?Brief History, Exam, Impression, and Recommendations:   ? ?Chronic left shoulder pain ?Orian returns, she is a very pleasant 51 year old female with longstanding history of left shoulder pain, after failure of conservative treatment including home physical therapy, several injections, ultimately we tried to get a MRI, she did not tolerate this so we did a CT arthrogram. ?The CT arthrogram shows what appears to be subscapularis fraying and as well as a small labral tear. ?She did see Dr. Tamera Punt, and will be getting a second opinion from Dr. Marlinda Mike. ?She can return to see me on an as-needed basis. ? ? ? ?___________________________________________ ?Gwen Her. Dianah Field, M.D., ABFM., CAQSM. ?Primary Care and Sports Medicine ?[facility] ? ?Adjunct Instructor of Family Medicine  ?[employer] ?

## 2021-09-30 ENCOUNTER — Encounter: Payer: Self-pay | Admitting: Sports Medicine

## 2021-09-30 DIAGNOSIS — S46012A Strain of muscle(s) and tendon(s) of the rotator cuff of left shoulder, initial encounter: Secondary | ICD-10-CM | POA: Diagnosis not present

## 2021-10-08 DIAGNOSIS — M25512 Pain in left shoulder: Secondary | ICD-10-CM | POA: Diagnosis not present

## 2021-10-09 DIAGNOSIS — M24112 Other articular cartilage disorders, left shoulder: Secondary | ICD-10-CM | POA: Diagnosis not present

## 2021-10-09 DIAGNOSIS — M65812 Other synovitis and tenosynovitis, left shoulder: Secondary | ICD-10-CM | POA: Diagnosis not present

## 2021-10-09 DIAGNOSIS — M75112 Incomplete rotator cuff tear or rupture of left shoulder, not specified as traumatic: Secondary | ICD-10-CM | POA: Diagnosis not present

## 2021-10-09 DIAGNOSIS — M19012 Primary osteoarthritis, left shoulder: Secondary | ICD-10-CM | POA: Diagnosis not present

## 2021-10-09 DIAGNOSIS — S43432A Superior glenoid labrum lesion of left shoulder, initial encounter: Secondary | ICD-10-CM | POA: Diagnosis not present

## 2021-10-09 DIAGNOSIS — G8918 Other acute postprocedural pain: Secondary | ICD-10-CM | POA: Diagnosis not present

## 2021-10-09 DIAGNOSIS — S46012A Strain of muscle(s) and tendon(s) of the rotator cuff of left shoulder, initial encounter: Secondary | ICD-10-CM | POA: Diagnosis not present

## 2021-10-09 DIAGNOSIS — M25512 Pain in left shoulder: Secondary | ICD-10-CM | POA: Diagnosis not present

## 2021-10-09 DIAGNOSIS — M7542 Impingement syndrome of left shoulder: Secondary | ICD-10-CM | POA: Diagnosis not present

## 2021-12-01 ENCOUNTER — Ambulatory Visit (INDEPENDENT_AMBULATORY_CARE_PROVIDER_SITE_OTHER): Payer: BC Managed Care – PPO | Admitting: Sports Medicine

## 2021-12-01 ENCOUNTER — Ambulatory Visit (INDEPENDENT_AMBULATORY_CARE_PROVIDER_SITE_OTHER): Payer: BC Managed Care – PPO

## 2021-12-01 DIAGNOSIS — M79602 Pain in left arm: Secondary | ICD-10-CM

## 2021-12-01 DIAGNOSIS — M25512 Pain in left shoulder: Secondary | ICD-10-CM | POA: Diagnosis not present

## 2021-12-01 DIAGNOSIS — R202 Paresthesia of skin: Secondary | ICD-10-CM | POA: Diagnosis not present

## 2021-12-01 DIAGNOSIS — G8929 Other chronic pain: Secondary | ICD-10-CM | POA: Diagnosis not present

## 2021-12-01 DIAGNOSIS — M5412 Radiculopathy, cervical region: Secondary | ICD-10-CM | POA: Diagnosis not present

## 2021-12-01 DIAGNOSIS — R200 Anesthesia of skin: Secondary | ICD-10-CM | POA: Diagnosis not present

## 2021-12-01 MED ORDER — PREDNISONE 50 MG PO TABS: ORAL_TABLET | ORAL | 0 refills | Status: DC

## 2021-12-01 NOTE — Assessment & Plan Note (Signed)
Debra Massey still has pain with in her shoulder but also in her left trapezius with radiation down the left arm to the third and fourth fingers, this is consistent with a left-sided C7 and C8 radiculitis. We will do a burst of prednisone, cervical spine x-rays, cervical spine home physical therapy, return to see me in 6 weeks, MR for epidural planning if not better.

## 2021-12-01 NOTE — Assessment & Plan Note (Signed)
Debra Massey returns, she is now 8 weeks post shoulder arthroscopic debridement, with subacromial decompression and labral debridement/repair. Still has some pain directly at the glenohumeral joint line with external rotation, limited to about 30 to 35 degrees of external rotation compared to normal on the contralateral side. She certainly still has some inflammatory process in her glenohumeral joint, I consider glenohumeral joint injection with ultrasound guidance today but we will do prednisone as above as I do think she has a radicular process as well.

## 2021-12-01 NOTE — Progress Notes (Signed)
    Procedures performed today:    None.  Independent interpretation of notes and tests performed by another provider:   None.  Brief History, Exam, Impression, and Recommendations:    Chronic left shoulder pain Jennfier returns, she is now 8 weeks post shoulder arthroscopic debridement, with subacromial decompression and labral debridement/repair. Still has some pain directly at the glenohumeral joint line with external rotation, limited to about 30 to 35 degrees of external rotation compared to normal on the contralateral side. She certainly still has some inflammatory process in her glenohumeral joint, I consider glenohumeral joint injection with ultrasound guidance today but we will do prednisone as above as I do think she has a radicular process as well.  Radiculitis of left cervical region Aaima still has pain with in her shoulder but also in her left trapezius with radiation down the left arm to the third and fourth fingers, this is consistent with a left-sided C7 and C8 radiculitis. We will do a burst of prednisone, cervical spine x-rays, cervical spine home physical therapy, return to see me in 6 weeks, MR for epidural planning if not better.  Chronic process with exacerbation and pharmacologic intervention  ____________________________________________ Ihor Austin. Benjamin Stain, M.D., ABFM., CAQSM., AME. Primary Care and Sports Medicine [facility]  Adjunct Professor of Family Medicine  [employer]  Restaurant manager, fast food

## 2021-12-23 DIAGNOSIS — S46012A Strain of muscle(s) and tendon(s) of the rotator cuff of left shoulder, initial encounter: Secondary | ICD-10-CM | POA: Diagnosis not present

## 2022-01-12 ENCOUNTER — Ambulatory Visit (INDEPENDENT_AMBULATORY_CARE_PROVIDER_SITE_OTHER): Payer: BC Managed Care – PPO | Admitting: Sports Medicine

## 2022-01-12 DIAGNOSIS — M5412 Radiculopathy, cervical region: Secondary | ICD-10-CM

## 2022-01-12 DIAGNOSIS — G8929 Other chronic pain: Secondary | ICD-10-CM | POA: Diagnosis not present

## 2022-01-12 DIAGNOSIS — M542 Cervicalgia: Secondary | ICD-10-CM

## 2022-01-12 DIAGNOSIS — M25512 Pain in left shoulder: Secondary | ICD-10-CM | POA: Diagnosis not present

## 2022-01-12 MED ORDER — TRIAZOLAM 0.25 MG PO TABS: ORAL_TABLET | ORAL | 0 refills | Status: DC

## 2022-01-12 NOTE — Assessment & Plan Note (Signed)
This pleasant 51 year old female returns, she is now approximately 14 weeks post shoulder arthroscopic debridement with subacromial decompression and labral debridement/repair, continues to have significant pain worse with external rotation and limited to about 30 to 35 degrees external rotation compared to normal on the contralateral side. Sounds like she saw her surgeon again after we did a course of prednisone, she had what sounds to be a subacromial injection but unfortunately still did not improve. She did have a CT due to claustrophobia. At this point I really think we need the MRI, arthrogram, shoulder and neck. She is profoundly claustrophobic, I will call in some triazolam and would like her to try 3 pills at night for bed to see if that is enough, if not she will do 4 pills prior to her MRI.

## 2022-01-12 NOTE — Progress Notes (Signed)
    Procedures performed today:    None.  Independent interpretation of notes and tests performed by another provider:   None.  Brief History, Exam, Impression, and Recommendations:    Chronic left shoulder pain This pleasant 51 year old female returns, she is now approximately 14 weeks post shoulder arthroscopic debridement with subacromial decompression and labral debridement/repair, continues to have significant pain worse with external rotation and limited to about 30 to 35 degrees external rotation compared to normal on the contralateral side. Sounds like she saw her surgeon again after we did a course of prednisone, she had what sounds to be a subacromial injection but unfortunately still did not improve. She did have a CT due to claustrophobia. At this point I really think we need the MRI, arthrogram, shoulder and neck. She is profoundly claustrophobic, I will call in some triazolam and would like her to try 3 pills at night for bed to see if that is enough, if not she will do 4 pills prior to her MRI.    ____________________________________________ Ihor Austin. Benjamin Stain, M.D., ABFM., CAQSM., AME. Primary Care and Sports Medicine [facility]  Adjunct Professor of Family Medicine  [employer]  Restaurant manager, fast food

## 2022-01-27 ENCOUNTER — Encounter: Payer: Self-pay | Admitting: Sports Medicine

## 2022-02-12 ENCOUNTER — Telehealth: Payer: Self-pay

## 2022-02-12 NOTE — Telephone Encounter (Signed)
Initiated Prior authorization [id number] 0.25MG  tablets Via: Covermymeds Case/Key: [id number] Status: approved  as of 02/12/22 Reason:Effective from 01/28/2022 through 01/27/2023. Notified Pt via: Mychart

## 2022-02-15 ENCOUNTER — Other Ambulatory Visit: Payer: Self-pay | Admitting: Sports Medicine

## 2022-02-15 DIAGNOSIS — G8929 Other chronic pain: Secondary | ICD-10-CM

## 2022-02-26 ENCOUNTER — Encounter: Payer: Self-pay | Admitting: Sports Medicine

## 2022-02-26 ENCOUNTER — Other Ambulatory Visit: Payer: Self-pay | Admitting: Physician Assistant

## 2022-02-26 ENCOUNTER — Other Ambulatory Visit: Payer: Self-pay | Admitting: Sports Medicine

## 2022-02-26 DIAGNOSIS — G8929 Other chronic pain: Secondary | ICD-10-CM

## 2022-02-26 DIAGNOSIS — R41840 Attention and concentration deficit: Secondary | ICD-10-CM

## 2022-02-26 MED ORDER — TRAMADOL HCL 50 MG PO TABS: 100.0000 mg | ORAL_TABLET | Freq: Three times a day (TID) | ORAL | 0 refills | Status: DC

## 2022-03-05 ENCOUNTER — Encounter: Payer: Self-pay | Admitting: Sports Medicine

## 2022-03-05 ENCOUNTER — Telehealth: Payer: Self-pay

## 2022-03-05 DIAGNOSIS — G8929 Other chronic pain: Secondary | ICD-10-CM

## 2022-03-05 NOTE — Telephone Encounter (Signed)
[facility] radiology called and left voice mail - states that received an order for MRI left shldr. And under reason for MRI shows intraocticular contrast- they want to know if this needs to be an optigram or regualr MRI   Requesting a call back at [phone]

## 2022-03-05 NOTE — Telephone Encounter (Signed)
Vanicia please see patients message, I think we put in the orders for here.  Do I need to place new orders for you to fax to [facility] imaging or can you just send them as is?

## 2022-03-05 NOTE — Telephone Encounter (Signed)
Yes this needs to be an arthrogram with intra-articular contrast.

## 2022-03-08 ENCOUNTER — Telehealth: Payer: Self-pay

## 2022-03-08 ENCOUNTER — Encounter: Payer: Self-pay | Admitting: Sports Medicine

## 2022-03-08 NOTE — Telephone Encounter (Signed)
Okay I placed a new order specifically stating MRI left shoulder arthrogram.  You can get it from the imaging tab and send it to the imaging facility.

## 2022-03-08 NOTE — Telephone Encounter (Signed)
Order printed and faxed to [facility] radiology .

## 2022-03-08 NOTE — Telephone Encounter (Signed)
New order placed

## 2022-03-08 NOTE — Telephone Encounter (Signed)
Debra Massey @ [facility] imaging called to report that the arthrogram needs a new order. She stated that it needs to be for arthrogram only. It then needs to be faxed to [phone].

## 2022-03-08 NOTE — Telephone Encounter (Signed)
Per  radiology - will need new order - as MRI Left shoulder arthrogram. She states it will not work if sent as with contrast because flurorscope? Would be missing but if just sent as MRI Left shoulder arthrogarm should be fine.

## 2022-03-10 NOTE — Telephone Encounter (Signed)
In the process of obtaining a third auth due to patient rescheduling their MRI study.

## 2022-03-12 NOTE — Telephone Encounter (Signed)
  New auth obtained, ref #[id number]. Valid from (03/11/22) through (04/09/22). [facility] imaging dept notified. Patient has been scheduled for 03/24/22 at 7 am.

## 2022-03-16 ENCOUNTER — Encounter (INDEPENDENT_AMBULATORY_CARE_PROVIDER_SITE_OTHER): Payer: BC Managed Care – PPO | Admitting: Sports Medicine

## 2022-03-16 DIAGNOSIS — M25512 Pain in left shoulder: Secondary | ICD-10-CM

## 2022-03-16 DIAGNOSIS — G8929 Other chronic pain: Secondary | ICD-10-CM

## 2022-03-17 MED ORDER — IBUPROFEN 800 MG PO TABS: 800.0000 mg | ORAL_TABLET | Freq: Three times a day (TID) | ORAL | 2 refills | Status: DC | PRN

## 2022-03-17 NOTE — Telephone Encounter (Signed)
I spent 5 total minutes of online digital evaluation and management services in this patient-initiated request for online care. 

## 2022-03-24 DIAGNOSIS — G8929 Other chronic pain: Secondary | ICD-10-CM | POA: Diagnosis not present

## 2022-03-24 DIAGNOSIS — M67814 Other specified disorders of tendon, left shoulder: Secondary | ICD-10-CM | POA: Diagnosis not present

## 2022-03-24 DIAGNOSIS — Z9889 Other specified postprocedural states: Secondary | ICD-10-CM | POA: Diagnosis not present

## 2022-03-24 DIAGNOSIS — S43432A Superior glenoid labrum lesion of left shoulder, initial encounter: Secondary | ICD-10-CM | POA: Diagnosis not present

## 2022-03-24 DIAGNOSIS — M25512 Pain in left shoulder: Secondary | ICD-10-CM | POA: Diagnosis not present

## 2022-03-24 DIAGNOSIS — M5412 Radiculopathy, cervical region: Secondary | ICD-10-CM | POA: Diagnosis not present

## 2022-03-24 DIAGNOSIS — M67912 Unspecified disorder of synovium and tendon, left shoulder: Secondary | ICD-10-CM | POA: Diagnosis not present

## 2022-03-26 ENCOUNTER — Ambulatory Visit (INDEPENDENT_AMBULATORY_CARE_PROVIDER_SITE_OTHER): Payer: BC Managed Care – PPO | Admitting: Sports Medicine

## 2022-03-26 DIAGNOSIS — M25512 Pain in left shoulder: Secondary | ICD-10-CM | POA: Diagnosis not present

## 2022-03-26 DIAGNOSIS — G8929 Other chronic pain: Secondary | ICD-10-CM | POA: Diagnosis not present

## 2022-03-26 MED ORDER — HYDROCODONE-ACETAMINOPHEN 5-325 MG PO TABS: 1.0000 | ORAL_TABLET | Freq: Three times a day (TID) | ORAL | 0 refills | Status: DC | PRN

## 2022-03-26 NOTE — Progress Notes (Signed)
    Procedures performed today:    None.  Independent interpretation of notes and tests performed by another provider:   None.  Brief History, Exam, Impression, and Recommendations:    Chronic left shoulder pain Anvita returns, she is a 51 year old female, she has a complex process, she had chronic shoulder pain, ultimately had arthroscopic surgery with Dr. Criss Alvine, appears to have had an acromioplasty, rotator cuff and bursal debridement, labral debridement. This was 6 months ago.  Unfortunately continues to have severe pain that she localizes over the deltoid, as well as anterior and posterior joint line, she has severe pain with passive motion, and exam significant loss of external rotation. She did see her surgeon, had what sounds to be a subacromial injection without much improvement. We obtained new MRI arthrogram that did show labral fraying and SLAP, acromioclavicular edema, rotator cuff fraying. She is very frustrated with her lack of pain relief 6 months out. 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 She does have ibuprofen 800 mg 3 times daily, I will add a short course of hydrocodone for breakthrough pain. I do think she needs a glenohumeral joint injection, but I would like an opinion from Dr. Everardo Pacific before proceeding. Complex regional pain syndrome is certainly in the differential but for further down, as this is not presenting quite as such.  I spent 30 minutes of total time managing this patient today, this includes chart review, face to face, and non-face to face time.  ____________________________________________ Ihor Austin. Benjamin Stain, M.D., ABFM., CAQSM., AME. Primary Care and Sports Medicine [facility]  Adjunct Professor of Family Medicine  [employer]  Restaurant manager, fast food

## 2022-03-26 NOTE — Assessment & Plan Note (Signed)
Debra Massey returns, she is a 51 year old female, she has a complex process, she had chronic shoulder pain, ultimately had arthroscopic surgery with Debra Massey, appears to have had an acromioplasty, rotator cuff and bursal debridement, labral debridement. This was 6 months ago.  Unfortunately continues to have severe pain that she localizes over the deltoid, as well as anterior and posterior joint line, she has severe pain with passive motion, and exam significant loss of external rotation. She did see her surgeon, had what sounds to be a subacromial injection without much improvement. We obtained new MRI arthrogram that did show labral fraying and SLAP, acromioclavicular edema, rotator cuff fraying. She is very frustrated with her lack of pain relief 6 months out. 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 She does have ibuprofen 800 mg 3 times daily, I will add a short course of hydrocodone for breakthrough pain. I do think she needs a glenohumeral joint injection, but I would like an opinion from Debra Massey before proceeding. Complex regional pain syndrome is certainly in the differential but for further down, as this is not presenting quite as such.

## 2022-04-01 DIAGNOSIS — M25512 Pain in left shoulder: Secondary | ICD-10-CM | POA: Diagnosis not present

## 2022-04-05 ENCOUNTER — Ambulatory Visit (INDEPENDENT_AMBULATORY_CARE_PROVIDER_SITE_OTHER): Payer: BC Managed Care – PPO | Admitting: Physician Assistant

## 2022-04-05 VITALS — BP 136/75 | HR 65 | Ht 64.0 in | Wt 131.0 lb

## 2022-04-05 DIAGNOSIS — Z1329 Encounter for screening for other suspected endocrine disorder: Secondary | ICD-10-CM | POA: Diagnosis not present

## 2022-04-05 DIAGNOSIS — E559 Vitamin D deficiency, unspecified: Secondary | ICD-10-CM

## 2022-04-05 DIAGNOSIS — Z0000 Encounter for general adult medical examination without abnormal findings: Secondary | ICD-10-CM | POA: Diagnosis not present

## 2022-04-05 DIAGNOSIS — Z13220 Encounter for screening for lipoid disorders: Secondary | ICD-10-CM

## 2022-04-05 DIAGNOSIS — R41840 Attention and concentration deficit: Secondary | ICD-10-CM

## 2022-04-05 DIAGNOSIS — Z131 Encounter for screening for diabetes mellitus: Secondary | ICD-10-CM

## 2022-04-05 DIAGNOSIS — F900 Attention-deficit hyperactivity disorder, predominantly inattentive type: Secondary | ICD-10-CM

## 2022-04-05 DIAGNOSIS — Z720 Tobacco use: Secondary | ICD-10-CM

## 2022-04-05 DIAGNOSIS — R454 Irritability and anger: Secondary | ICD-10-CM

## 2022-04-05 DIAGNOSIS — F411 Generalized anxiety disorder: Secondary | ICD-10-CM

## 2022-04-05 DIAGNOSIS — Z122 Encounter for screening for malignant neoplasm of respiratory organs: Secondary | ICD-10-CM

## 2022-04-05 MED ORDER — DESVENLAFAXINE SUCCINATE ER 50 MG PO TB24: 50.0000 mg | ORAL_TABLET | Freq: Every day | ORAL | 1 refills | Status: DC

## 2022-04-05 MED ORDER — AMPHETAMINE-DEXTROAMPHET ER 10 MG PO CP24: 10.0000 mg | ORAL_CAPSULE | Freq: Every day | ORAL | 0 refills | Status: DC

## 2022-04-05 NOTE — Patient Instructions (Signed)
Will schedule lung cancer screening  Health Maintenance, Female Adopting a healthy lifestyle and getting preventive care are important in promoting health and wellness. Ask your health care provider about: The right schedule for you to have regular tests and exams. Things you can do on your own to prevent diseases and keep yourself healthy. What should I know about diet, weight, and exercise? Eat a healthy diet  Eat a diet that includes plenty of vegetables, fruits, low-fat dairy products, and lean protein. Do not eat a lot of foods that are high in solid fats, added sugars, or sodium. Maintain a healthy weight Body mass index (BMI) is used to identify weight problems. It estimates body fat based on height and weight. Your health care provider can help determine your BMI and help you achieve or maintain a healthy weight. Get regular exercise Get regular exercise. This is one of the most important things you can do for your health. Most adults should: Exercise for at least 150 minutes each week. The exercise should increase your heart rate and make you sweat (moderate-intensity exercise). Do strengthening exercises at least twice a week. This is in addition to the moderate-intensity exercise. Spend less time sitting. Even light physical activity can be beneficial. Watch cholesterol and blood lipids Have your blood tested for lipids and cholesterol at 50 years of age, then have this test every 5 years. Have your cholesterol levels checked more often if: Your lipid or cholesterol levels are high. You are older than 50 years of age. You are at high risk for heart disease. What should I know about cancer screening? Depending on your health history and family history, you may need to have cancer screening at various ages. This may include screening for: Breast cancer. Cervical cancer. Colorectal cancer. Skin cancer. Lung cancer. What should I know about heart disease, diabetes, and high  blood pressure? Blood pressure and heart disease High blood pressure causes heart disease and increases the risk of stroke. This is more likely to develop in people who have high blood pressure readings or are overweight. Have your blood pressure checked: Every 3-5 years if you are 18-9 years of age. Every year if you are 73 years old or older. Diabetes Have regular diabetes screenings. This checks your fasting blood sugar level. Have the screening done: Once every three years after age 64 if you are at a normal weight and have a low risk for diabetes. More often and at a younger age if you are overweight or have a high risk for diabetes. What should I know about preventing infection? Hepatitis B If you have a higher risk for hepatitis B, you should be screened for this virus. Talk with your health care provider to find out if you are at risk for hepatitis B infection. Hepatitis C Testing is recommended for: Everyone born from [birth year] through 1964. Anyone with known risk factors for hepatitis C. Sexually transmitted infections (STIs) Get screened for STIs, including gonorrhea and chlamydia, if: You are sexually active and are younger than 50 years of age. You are older than 50 years of age and your health care provider tells you that you are at risk for this type of infection. Your sexual activity has changed since you were last screened, and you are at increased risk for chlamydia or gonorrhea. Ask your health care provider if you are at risk. Ask your health care provider about whether you are at high risk for HIV. Your health care provider may recommend a  prescription medicine to help prevent HIV infection. If you choose to take medicine to prevent HIV, you should first get tested for HIV. You should then be tested every 3 months for as long as you are taking the medicine. Pregnancy If you are about to stop having your period (premenopausal) and you may become pregnant, seek counseling  before you get pregnant. Take 400 to 800 micrograms (mcg) of folic acid every day if you become pregnant. Ask for birth control (contraception) if you want to prevent pregnancy. Osteoporosis and menopause Osteoporosis is a disease in which the bones lose minerals and strength with aging. This can result in bone fractures. If you are 77 years old or older, or if you are at risk for osteoporosis and fractures, ask your health care provider if you should: Be screened for bone loss. Take a calcium or vitamin D supplement to lower your risk of fractures. Be given hormone replacement therapy (HRT) to treat symptoms of menopause. Follow these instructions at home: Alcohol use Do not drink alcohol if: Your health care provider tells you not to drink. You are pregnant, may be pregnant, or are planning to become pregnant. If you drink alcohol: Limit how much you have to: 0-1 drink a day. Know how much alcohol is in your drink. In the [place] one drink equals one 12 oz bottle of beer (355 mL), one 5 oz glass of wine (148 mL), or one 1 oz glass of hard liquor (44 mL). Lifestyle Do not use any products that contain nicotine or tobacco. These products include cigarettes, chewing tobacco, and vaping devices, such as e-cigarettes. If you need help quitting, ask your health care provider. Do not use street drugs. Do not share needles. Ask your health care provider for help if you need support or information about quitting drugs. General instructions Schedule regular health, dental, and eye exams. Stay current with your vaccines. Tell your health care provider if: You often feel depressed. You have ever been abused or do not feel safe at home. Summary Adopting a healthy lifestyle and getting preventive care are important in promoting health and wellness. Follow your health care provider's instructions about healthy diet, exercising, and getting tested or screened for diseases. Follow your health care  provider's instructions on monitoring your cholesterol and blood pressure. This information is not intended to replace advice given to you by your health care provider. Make sure you discuss any questions you have with your health care provider. Document Revised: 09/22/2020 Document Reviewed: 09/22/2020 Elsevier Patient Education  [place].

## 2022-04-05 NOTE — Progress Notes (Unsigned)
Annual Wellness Visit     Patient: Debra Massey, Female    DOB: 1970-11-30, 51 y.o.   MRN: [id number]  Subjective  Chief Complaint  Patient presents with   Annual Exam    Debra Massey is a 51 y.o. female who presents today for her Annual Wellness Visit. She reports consuming a {diet types:17450} diet. {Exercise:19826} She generally feels {well/fairly well/poorly:18703}. She reports sleeping {well/fairly well/poorly:18703}. She is being seen by Dr. Karie Schwalbe for shoulder pain. She still experiences depression, but states this past week is the best she has felt in a while. She is still vaping. Denies any chest pain, shortness of breath, changes in bowel or urinary habits.  HPI  {VISON DENTAL STD PSA (Optional):27386}   {History (Optional):23778}  Medications: Outpatient Medications Prior to Visit  Medication Sig   HYDROcodone-acetaminophen (NORCO/VICODIN) 5-325 MG tablet Take 1 tablet by mouth every 8 (eight) hours as needed for moderate pain.   ibuprofen (ADVIL) 800 MG tablet Take 1 tablet (800 mg total) by mouth every 8 (eight) hours as needed.   [DISCONTINUED] amphetamine-dextroamphetamine (ADDERALL XR) 10 MG 24 hr capsule Take 1 capsule (10 mg total) by mouth daily.   [DISCONTINUED] amphetamine-dextroamphetamine (ADDERALL XR) 10 MG 24 hr capsule Take 1 capsule (10 mg total) by mouth daily.   [DISCONTINUED] amphetamine-dextroamphetamine (ADDERALL XR) 10 MG 24 hr capsule Take 1 capsule (10 mg total) by mouth daily.   [DISCONTINUED] traMADol (ULTRAM) 50 MG tablet Take 2 tablets (100 mg total) by mouth in the morning, at noon, and at bedtime.   [DISCONTINUED] desvenlafaxine (PRISTIQ) 50 MG 24 hr tablet Take 1 tablet (50 mg total) by mouth daily. (Patient not taking: Reported on 04/05/2022)   [DISCONTINUED] estradiol (ESTRACE VAGINAL) 0.1 MG/GM vaginal cream Place 1 g vaginally 3 (three) times a week.   [DISCONTINUED] gabapentin (NEURONTIN) 300 MG capsule One tab PO qHS for a week, then BID  for a week, then TID. May double weekly to a max of 3,600mg /day   [DISCONTINUED] methocarbamol (ROBAXIN) 500 MG tablet Take 1 tablet (500 mg total) by mouth 3 (three) times daily.   [DISCONTINUED] predniSONE (DELTASONE) 50 MG tablet One tab PO daily for 5 days.   [DISCONTINUED] triazolam (HALCION) 0.25 MG tablet 3-4 tabs PO 2 hours before procedure or imaging.  Do not drive with this medication.   No facility-administered medications prior to visit.    Allergies  Allergen Reactions   Hydrocodone Bit-Homatrop Mbr Other (See Comments)    Dizziness, almost passed out    Patient Care Team: Nolene Ebbs as PCP - General (Family Medicine)  Review of Systems  Respiratory:  Negative for shortness of breath.   Cardiovascular:  Negative for chest pain and palpitations.  Gastrointestinal:  Negative for constipation, diarrhea, nausea and vomiting.  Musculoskeletal:  Positive for joint pain.  Psychiatric/Behavioral:  Positive for depression.         Objective  BP 136/75   Pulse 65   Ht 5\' 4"  (1.626 m)   Wt 59.4 kg   LMP 07/05/2016   SpO2 99%   BMI 22.49 kg/m  {Vitals History (Optional):23777}  Physical Exam Constitutional:      Appearance: Normal appearance.  HENT:     Head: Normocephalic.  Eyes:     Extraocular Movements: Extraocular movements intact.     Pupils: Pupils are equal, round, and reactive to light.  Cardiovascular:     Rate and Rhythm: Normal rate and regular rhythm.     Pulses: Normal pulses.  Pulmonary:     Effort: Pulmonary effort is normal.     Breath sounds: Normal breath sounds.  Abdominal:     General: Bowel sounds are normal.     Palpations: Abdomen is soft.  Skin:    General: Skin is warm and dry.     Findings: No rash.  Neurological:     General: No focal deficit present.     Mental Status: She is alert and oriented to person, place, and time.      Most recent functional status assessment:     No data to display         Most  recent fall risk assessment:    04/05/2022    3:34 PM  [place] in the past year? 0  Number falls in past yr: 0  Injury with Fall? 0  Risk for fall due to : No Fall Risks  Follow up Falls evaluation completed    Most recent depression screenings:    04/05/2022    3:44 PM 06/16/2021    1:50 PM  PHQ 2/9 Scores  PHQ - 2 Score 3 3  PHQ- 9 Score 10 9   Most recent cognitive screening:     No data to display         Most recent Audit-C alcohol use screening     No data to display         A score of 3 or more in women, and 4 or more in men indicates increased risk for alcohol abuse, EXCEPT if all of the points are from question 1   Vision/Hearing Screen: No results found.  {Labs (Optional):23779}  No results found for any visits on 04/05/22.    Assessment & Plan   Annual wellness visit done today including the all of the following: Reviewed patient's Family Medical History Reviewed and updated list of patient's medical providers Assessment of cognitive impairment was done Assessed patient's functional ability Established a written schedule for health screening [place] Completed and Reviewed  Continues to smoke and vape. Low-dose CT scan ordered for lung cancer screening.     Immunization History  Administered Date(s) Administered   Influenza,inj,Quad PF,6+ Mos 07/19/2018, 02/13/2019, 03/17/2020   PFIZER(Purple Top)SARS-COV-2 Vaccination 12/12/2019, 01/09/2020   Tdap 05/17/2010, 04/21/2011, 12/09/2020    Health Maintenance  Topic Date Due   HIV Screening  Never done   Lung Cancer Screening  Never done   PAP SMEAR-Modifier  04/05/2022 (Originally 06/27/2019)   COVID-19 Vaccine (3 - Pfizer risk series) 04/21/2022 (Originally 02/06/2020)   Zoster Vaccines- Shingrix (1 of 2) 07/06/2022 (Originally 12/29/1990)   INFLUENZA VACCINE  08/15/2022 (Originally 12/15/2021)   MAMMOGRAM  04/06/2023 (Originally 07/10/2021)   COLONOSCOPY (Pts  45-38yrs Insurance coverage will need to be confirmed)  04/06/2023 (Originally 12/28/2016)   Hepatitis C Screening  Completed   HPV VACCINES  Aged Out     Discussed health benefits of physical activity, and encouraged her to engage in regular exercise appropriate for her age and condition.    Problem List Items Addressed This Visit       Other   GAD (generalized anxiety disorder)   Relevant Medications   desvenlafaxine (PRISTIQ) 50 MG 24 hr tablet   Inattention   Relevant Medications   amphetamine-dextroamphetamine (ADDERALL XR) 10 MG 24 hr capsule   amphetamine-dextroamphetamine (ADDERALL XR) 10 MG 24 hr capsule (Start on 05/05/2022)   amphetamine-dextroamphetamine (ADDERALL XR) 10 MG 24 hr capsule (Start on 06/05/2022)  Irritability and anger   Relevant Medications   desvenlafaxine (PRISTIQ) 50 MG 24 hr tablet   Vitamin D insufficiency   Relevant Orders   Vitamin D (25 hydroxy)   Attention deficit hyperactivity disorder (ADHD), predominantly inattentive type   Other Visit Diagnoses     Physical exam, annual    -  Primary   Relevant Orders   TSH   Lipid Panel w/reflex Direct LDL   COMPLETE METABOLIC PANEL WITH GFR   CBC with Differential/Platelet   Vitamin D (25 hydroxy)   Thyroid disorder screen       Relevant Orders   TSH   Lipid screening       Relevant Orders   Lipid Panel w/reflex Direct LDL   Diabetes mellitus screening       Relevant Orders   COMPLETE METABOLIC PANEL WITH GFR           Dorian Heckle, Student-PA

## 2022-04-06 ENCOUNTER — Encounter: Payer: Self-pay | Admitting: Physician Assistant

## 2022-04-19 ENCOUNTER — Encounter: Payer: Self-pay | Admitting: Sports Medicine

## 2022-04-19 DIAGNOSIS — M7502 Adhesive capsulitis of left shoulder: Secondary | ICD-10-CM | POA: Diagnosis not present

## 2022-04-19 DIAGNOSIS — M25612 Stiffness of left shoulder, not elsewhere classified: Secondary | ICD-10-CM | POA: Diagnosis not present

## 2022-04-20 ENCOUNTER — Ambulatory Visit (INDEPENDENT_AMBULATORY_CARE_PROVIDER_SITE_OTHER): Payer: BC Managed Care – PPO

## 2022-04-20 ENCOUNTER — Ambulatory Visit (INDEPENDENT_AMBULATORY_CARE_PROVIDER_SITE_OTHER): Payer: BC Managed Care – PPO | Admitting: Sports Medicine

## 2022-04-20 DIAGNOSIS — M25512 Pain in left shoulder: Secondary | ICD-10-CM

## 2022-04-20 DIAGNOSIS — G8929 Other chronic pain: Secondary | ICD-10-CM | POA: Diagnosis not present

## 2022-04-20 NOTE — Progress Notes (Signed)
    Procedures performed today:    Procedure: Real-time Ultrasound Guided injection of the left glenohumeral joint Device: Samsung HS60  Verbal informed consent obtained.  Time-out conducted.  Noted no overlying erythema, induration, or other signs of local infection.  Skin prepped in a sterile fashion.  Local anesthesia: Topical Ethyl chloride.  With sterile technique and under real time ultrasound guidance: Noted trace effusion, 1 cc Kenalog 40, 2 cc lidocaine, 2 cc bupivacaine injected easily Completed without difficulty  Advised to call if fevers/chills, erythema, induration, drainage, or persistent bleeding.  Images permanently stored and available for review in PACS.  Impression: Technically successful ultrasound guided injection.  Independent interpretation of notes and tests performed by another provider:   None.  Brief History, Exam, Impression, and Recommendations:    Chronic left shoulder pain This is a pleasant 51 year old female, she has a very complex process in her shoulder, she has had chronic shoulder pain, left-sided, ultimately had arthroscopic surgery with Dr. Criss Alvine, it appears she had an acromioplasty as well as rotator cuff and bursal debridement, labral debridement 6 months ago. Unfortunately continued to have severe pain that she localized over the deltoid as well as anterior and posterior joint lines. Pain was severe with passive motion with significant loss of external rotation. She did see her surgeon again and had what sounds to be a subacromial injection and did not improve. I did obtain an CT arthrogram that showed some labral fraying and a SLAP tear, acromioclavicular edema, rotator cuff fraying. She was very frustrated with her lack of pain relief 6 months postop. I did suspect she developed postop adhesive capsulitis, she did not have a glenohumeral joint injection yet. We had some hydrocodone for breakthrough pain and got a second opinion from  Dr. 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 Everardo Pacific. I do suspect once we get her out of this severe pain she will need labral repair.    ____________________________________________ Ihor Austin. Benjamin Stain, M.D., ABFM., CAQSM., AME. Primary Care and Sports Medicine [facility]  Adjunct Professor of Family Medicine  [employer]  Restaurant manager, fast food

## 2022-04-20 NOTE — Assessment & Plan Note (Signed)
This is a pleasant 51 year old female, she has a very complex process in her shoulder, she has had chronic shoulder pain, left-sided, ultimately had arthroscopic surgery with Dr. Criss Alvine, it appears she had an acromioplasty as well as rotator cuff and bursal debridement, labral debridement 6 months ago. Unfortunately continued to have severe pain that she localized over the deltoid as well as anterior and posterior joint lines. Pain was severe with passive motion with significant loss of external rotation. She did see her surgeon again and had what sounds to be a subacromial injection and did not improve. I did obtain an CT arthrogram that showed some labral fraying and a SLAP tear, acromioclavicular edema, rotator cuff fraying. She was very frustrated with her lack of pain relief 6 months postop. I did suspect she developed postop adhesive capsulitis, she did not have a glenohumeral joint injection yet. We had some hydrocodone for breakthrough pain and got a second opinion from Dr. 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 Everardo Pacific. I do suspect once we get her out of this severe pain she will need labral repair.

## 2022-04-23 DIAGNOSIS — M7502 Adhesive capsulitis of left shoulder: Secondary | ICD-10-CM | POA: Diagnosis not present

## 2022-04-23 DIAGNOSIS — M25612 Stiffness of left shoulder, not elsewhere classified: Secondary | ICD-10-CM | POA: Diagnosis not present

## 2022-04-27 DIAGNOSIS — M7502 Adhesive capsulitis of left shoulder: Secondary | ICD-10-CM | POA: Diagnosis not present

## 2022-04-27 DIAGNOSIS — M25612 Stiffness of left shoulder, not elsewhere classified: Secondary | ICD-10-CM | POA: Diagnosis not present

## 2022-04-29 DIAGNOSIS — M7502 Adhesive capsulitis of left shoulder: Secondary | ICD-10-CM | POA: Diagnosis not present

## 2022-04-30 ENCOUNTER — Other Ambulatory Visit: Payer: Self-pay

## 2022-04-30 ENCOUNTER — Encounter (HOSPITAL_BASED_OUTPATIENT_CLINIC_OR_DEPARTMENT_OTHER): Payer: Self-pay | Admitting: Orthopaedic Surgery

## 2022-05-05 NOTE — Progress Notes (Signed)
Surgical soap given with instructions, pt verbalized understanding. Benzoyl peroxide gel given with instructions, pt verbalized understanding.  

## 2022-05-09 NOTE — Discharge Instructions (Signed)
Ramond Marrow MD, MPH Alfonse Alpers, PA-C [facility] [address], [address] [phone] (tel)   [phone] (fax)   POST-OPERATIVE INSTRUCTIONS - SHOULDER ARTHROSCOPY  WOUND CARE You may remove the Operative Dressing on Post-Op Day #3 (72hrs after surgery).   Alternatively if you would like you can leave dressing on until follow-up if within 7-8 days but keep it dry. Leave steri-strips in place until they fall off on their own, usually 2 weeks postop. There may be a small amount of fluid/bleeding leaking at the surgical site.  This is normal; the shoulder is filled with fluid during the procedure and can leak for 24-48hrs after surgery.  You may change/reinforce the bandage as needed.  Use the Cryocuff or Ice as often as possible for the first 7 days, then as needed for pain relief. Always keep a towel, ACE wrap or other barrier between the cooling unit and your skin.  You may shower on Post-Op Day #3. Gently pat the area dry.  Do not soak the shoulder in water or submerge it.  Keep incisions as dry as possible. Do not go swimming in the pool or ocean until 4 weeks after surgery or when otherwise instructed.    EXERCISES Sling should be discontinued once your nerve block wears off (within 2-3 days postop)   It is normal for your fingers/hand to become more swollen after surgery and discolored from bruising.   This will resolve over the first few weeks usually after surgery. Please continue to ambulate and do not stay sitting or lying for too long. Perform foot and wrist pumps to assist in circulation.   PHYSICAL THERAPY -You will begin physical therapy soon after surgery - Please schedule a physical therapy appointment ASAP if you have not already done so - Let our office if there are any issues with scheduling your therapy  - Physical therapy will be extremely important for your recovery   - You have a physical therapy appointment scheduled at [facility]  (across the hall from our office) on Wednesday 12/27   REGIONAL ANESTHESIA (NERVE BLOCKS) The anesthesia team may have performed a nerve block for you this is a great tool used to minimize pain.   The block may start wearing off overnight (between 8-24 hours postop) When the block wears off, your pain may go from nearly zero to the pain you would have had postop without the block. This is an abrupt transition but nothing dangerous is happening.   This can be a challenging period but utilize your as needed pain medications to try and manage this period. We suggest you use the pain medication the first night prior to going to bed, to ease this transition.  You may take an extra dose of narcotic when this happens if needed  POST-OP MEDICATIONS- Multimodal approach to pain control In general your pain will be controlled with a combination of substances.  Prescriptions unless otherwise discussed are electronically sent to your pharmacy.  This is a carefully made plan we use to minimize narcotic use.     Celebrex - Anti-inflammatory medication taken on a scheduled basis Acetaminophen - Non-narcotic pain medicine taken on a scheduled basis  Gabapentin - this is to help with nerve based pain, take on a scheduled basis  Oxycodone - This is a strong narcotic, to be used only on an "as needed" basis for SEVERE pain. Zofran - take as needed for nausea   FOLLOW-UP If you develop a Fever (?101.5),  Redness or Drainage from the surgical incision site, please call our office to arrange for an evaluation. Please call the office to schedule a follow-up appointment for your first post-operative appointment, 7-10 days post-operatively.    HELPFUL INFORMATION   You may be more comfortable sleeping in a semi-seated position the first few nights following surgery.  Keep a pillow propped under the elbow and forearm for comfort.  If you have a recliner type of chair it might be beneficial.  If not that is fine  too, but it would be helpful to sleep propped up with pillows behind your operated shoulder as well under your elbow and forearm.  This will reduce pulling on the suture lines.  When dressing, put your operative arm in the sleeve first.  When getting undressed, take your operative arm out last.  Loose fitting, button-down shirts are recommended.  Often in the first days after surgery you may be more comfortable keeping your operative arm under your shirt and not through the sleeve.  You may return to work/school in the next couple of days when you feel up to it.  Desk work and typing in the sling is fine.  We suggest you use the pain medication the first night prior to going to bed, in order to ease any pain when the anesthesia wears off. You should avoid taking pain medications on an empty stomach as it will make you nauseous.  You should wean off your narcotic medicines as soon as you are able.  Most patients will be off or using minimal narcotics before their first postop appointment.   Do not drink alcoholic beverages or take illicit drugs when taking pain medications.  It is against the law to drive while taking narcotics.  In some states it is against the law to drive while your arm is in a sling.   Pain medication may make you constipated.  Below are a few solutions to try in this order: Decrease the amount of pain medication if you aren't having pain. Drink lots of decaffeinated fluids. Drink prune juice and/or eat dried prunes  If the first 3 don't work start with additional solutions Take Colace - an over-the-counter stool softener Take Senokot - an over-the-counter laxative Take Miralax - a stronger over-the-counter laxative  For more information including helpful videos and documents visit our website:   [email or web address]     Post Anesthesia Home Care Instructions  Activity: Get plenty of rest for the remainder of the day. A responsible  individual must stay with you for 24 hours following the procedure.  For the next 24 hours, DO NOT: -Drive a car -Advertising copywriter -Drink alcoholic beverages -Take any medication unless instructed by your physician -Make any legal decisions or sign important papers.  Meals: Start with liquid foods such as gelatin or soup. Progress to regular foods as tolerated. Avoid greasy, spicy, heavy foods. If nausea and/or vomiting occur, drink only clear liquids until the nausea and/or vomiting subsides. Call your physician if vomiting continues.  Special Instructions/Symptoms: Your throat may feel dry or sore from the anesthesia or the breathing tube placed in your throat during surgery. If this causes discomfort, gargle with warm salt water. The discomfort should disappear within 24 hours.  Can take tylenol after 3:25p Can take Ibuprofen after 6:30 p

## 2022-05-09 NOTE — H&P (Signed)
PREOPERATIVE H&P  Chief Complaint: left shoulder cartilage disorger,adhesive capsulitits  HPI: Debra Massey is a 51 y.o. female who is scheduled for, Procedure(s): ARTHROSCOPY SHOULDER.   Patient is a 51 year-old who had a shoulder arthroscopy with Dr. Jonnie Kind in the [place] area in May.  She had no therapy afterwards.  She reports she had full motion.  She had an event about five weeks after surgery when she had acute severe pain.  She said she was in tears.  She was not able to range her shoulder anymore.  She saw initial surgeon only twice postoperatively.  At the second visit she was given an injection and she never followed up again.  She said the injection did not help, even temporarily.  At this point she has significant pain. We have been worried about CRPS versus arthrofibrosis after a previous surgery.  She is doing better now.  She did a Hyalgan injection with Dr. Benjamin Stain and has been doing therapy.  Unfortunately she still feels like she is quite limited with her range of motion.    Symptoms are rated as moderate to severe, and have been worsening.  This is significantly impairing activities of daily living.    Please see clinic note for further details on this patient's care.    She has elected for surgical management.   Past Medical History:  Diagnosis Date   ADHD (attention deficit hyperactivity disorder)    Anxiety    Arthritis    Past Surgical History:  Procedure Laterality Date   BREAST BIOPSY Left 2008   breast biospy  2008   benign   BREAST EXCISIONAL BIOPSY Left 2008   CHOLECYSTECTOMY     2010   laprascopy     Social History   Socioeconomic History   Marital status: Married    Spouse name: Not on file   Number of children: Not on file   Years of education: Not on file   Highest education level: Not on file  Occupational History   Not on file  Tobacco Use   Smoking status: Former    Packs/day: 1.00    Years: 27.00    Total pack  years: 27.00    Types: Cigarettes   Smokeless tobacco: Never   Tobacco comments:    pt is vaping  Vaping Use   Vaping Use: Every day   Substances: Nicotine  Substance and Sexual Activity   Alcohol use: No   Drug use: No   Sexual activity: Yes    Birth control/protection: None  Other Topics Concern   Not on file  Social History Narrative   Not on file   Social Determinants of Health   Financial Resource Strain: Not on file  Food Insecurity: Not on file  Transportation Needs: Not on file  Physical Activity: Not on file  Stress: Not on file  Social Connections: Not on file   Family History  Problem Relation Age of Onset   Cancer Mother        breast   Diabetes Mother    Hypertension Mother    Breast cancer Mother 22   Diabetes Father    Allergies  Allergen Reactions   Hydrocodone Bit-Homatrop Mbr Other (See Comments)    Dizziness, almost passed out   Prior to Admission medications   Medication Sig Start Date End Date Taking? Authorizing Provider  amphetamine-dextroamphetamine (ADDERALL XR) 10 MG 24 hr capsule Take 1 capsule (10 mg total) by mouth daily. 06/05/22  Yes  Breeback, Jade L, PA-C  celecoxib (CELEBREX) 100 MG capsule Take 100 mg by mouth 2 (two) times daily.   Yes [provider]  desvenlafaxine (PRISTIQ) 50 MG 24 hr tablet Take 1 tablet (50 mg total) by mouth daily. 04/05/22  Yes Breeback, Jade L, PA-C  ibuprofen (ADVIL) 800 MG tablet Take 1 tablet (800 mg total) by mouth every 8 (eight) hours as needed. 03/17/22   Monica Becton, MD    ROS: All other systems have been reviewed and were otherwise negative with the exception of those mentioned in the HPI and as above.  Physical Exam: General: Alert, no acute distress Cardiovascular: No pedal edema Respiratory: No cyanosis, no use of accessory musculature GI: No organomegaly, abdomen is soft and non-tender Skin: No lesions in the area of chief complaint Neurologic: Sensation intact  distally Psychiatric: Patient is competent for consent with normal mood and affect Lymphatic: No axillary or cervical lymphadenopathy  MUSCULOSKELETAL:  Active forward elevation to 100, passive to 100 with a hard end point.  External rotation of 20.  5/5 cuff strength.  Much more tolerable exam today than last visit  Imaging: MRI demonstrates some tendinosis of the upper border of the subscapularis and supraspinatus.  She also has a SLAP tear and some mild fraying of the anterior and posterior labrum.    Assessment: left shoulder cartilage disorger,adhesive capsulitits  Plan: Plan for Procedure(s): ARTHROSCOPY SHOULDER  The risks benefits and alternatives were discussed with the patient including but not limited to the risks of nonoperative treatment, versus surgical intervention including infection, bleeding, nerve injury,  blood clots, cardiopulmonary complications, morbidity, mortality, among others, and they were willing to proceed.   The patient acknowledged the explanation, agreed to proceed with the plan and consent was signed.   Operative Plan: Left shoulder scope with lysis of adhesions and manipulation under anesthesia Discharge Medications: Standard DVT Prophylaxis: none Physical Therapy: Outpatient PT Special Discharge needs: Sling. IceMan   Vernetta Honey, PA-C  05/09/2022 2:23 PM

## 2022-05-11 ENCOUNTER — Other Ambulatory Visit: Payer: Self-pay

## 2022-05-11 ENCOUNTER — Ambulatory Visit (HOSPITAL_BASED_OUTPATIENT_CLINIC_OR_DEPARTMENT_OTHER): Payer: BC Managed Care – PPO | Admitting: Certified Registered"

## 2022-05-11 ENCOUNTER — Ambulatory Visit (HOSPITAL_BASED_OUTPATIENT_CLINIC_OR_DEPARTMENT_OTHER)
Admission: RE | Admit: 2022-05-11 | Discharge: 2022-05-11 | Disposition: A | Discharge status: Home / Self Care | Payer: BC Managed Care – PPO | Source: Ambulatory Visit | Attending: Orthopaedic Surgery | Admitting: Orthopaedic Surgery

## 2022-05-11 ENCOUNTER — Encounter (HOSPITAL_BASED_OUTPATIENT_CLINIC_OR_DEPARTMENT_OTHER)
Admission: RE | Disposition: A | Discharge status: Home / Self Care | Payer: Self-pay | Source: Ambulatory Visit | Attending: Orthopaedic Surgery

## 2022-05-11 ENCOUNTER — Encounter (HOSPITAL_BASED_OUTPATIENT_CLINIC_OR_DEPARTMENT_OTHER): Payer: Self-pay | Admitting: Orthopaedic Surgery

## 2022-05-11 DIAGNOSIS — M24112 Other articular cartilage disorders, left shoulder: Secondary | ICD-10-CM | POA: Diagnosis not present

## 2022-05-11 DIAGNOSIS — Z9889 Other specified postprocedural states: Secondary | ICD-10-CM | POA: Insufficient documentation

## 2022-05-11 DIAGNOSIS — Z87891 Personal history of nicotine dependence: Secondary | ICD-10-CM | POA: Diagnosis not present

## 2022-05-11 DIAGNOSIS — M24612 Ankylosis, left shoulder: Secondary | ICD-10-CM | POA: Diagnosis not present

## 2022-05-11 DIAGNOSIS — G8918 Other acute postprocedural pain: Secondary | ICD-10-CM | POA: Diagnosis not present

## 2022-05-11 DIAGNOSIS — M7502 Adhesive capsulitis of left shoulder: Secondary | ICD-10-CM | POA: Diagnosis not present

## 2022-05-11 DIAGNOSIS — M1990 Unspecified osteoarthritis, unspecified site: Secondary | ICD-10-CM | POA: Diagnosis not present

## 2022-05-11 DIAGNOSIS — F988 Other specified behavioral and emotional disorders with onset usually occurring in childhood and adolescence: Secondary | ICD-10-CM | POA: Insufficient documentation

## 2022-05-11 DIAGNOSIS — Z01818 Encounter for other preprocedural examination: Secondary | ICD-10-CM

## 2022-05-11 HISTORY — PX: SHOULDER ARTHROSCOPY: SHX128

## 2022-05-11 HISTORY — DX: Attention-deficit hyperactivity disorder, unspecified type: F90.9

## 2022-05-11 HISTORY — DX: Anxiety disorder, unspecified: F41.9

## 2022-05-11 SURGERY — ARTHROSCOPY, SHOULDER
Anesthesia: Regional | Site: Shoulder | Laterality: Left

## 2022-05-11 MED ORDER — PROMETHAZINE HCL 25 MG/ML IJ SOLN: 6.2500 mg | INTRAMUSCULAR | Status: DC | PRN

## 2022-05-11 MED ORDER — FENTANYL CITRATE (PF) 100 MCG/2ML IJ SOLN
INTRAMUSCULAR | Status: AC
  Filled 2022-05-11: qty 2

## 2022-05-11 MED ORDER — ACETAMINOPHEN 500 MG PO TABS
ORAL_TABLET | ORAL | Status: AC
  Filled 2022-05-11: qty 2

## 2022-05-11 MED ORDER — PROPOFOL 10 MG/ML IV BOLUS
INTRAVENOUS | Status: AC
  Filled 2022-05-11: qty 20

## 2022-05-11 MED ORDER — ROCURONIUM BROMIDE 100 MG/10ML IV SOLN
INTRAVENOUS | Status: DC | PRN
  Administered 2022-05-11: 40 mg via INTRAVENOUS

## 2022-05-11 MED ORDER — PROPOFOL 10 MG/ML IV BOLUS
INTRAVENOUS | Status: DC | PRN
  Administered 2022-05-11: 100 mg via INTRAVENOUS

## 2022-05-11 MED ORDER — LIDOCAINE 2% (20 MG/ML) 5 ML SYRINGE
INTRAMUSCULAR | Status: DC | PRN
  Administered 2022-05-11: 40 mg via INTRAVENOUS

## 2022-05-11 MED ORDER — SODIUM CHLORIDE 0.9 % IR SOLN
Status: DC | PRN
  Administered 2022-05-11: 2000 mL

## 2022-05-11 MED ORDER — ONDANSETRON HCL 4 MG/2ML IJ SOLN
INTRAMUSCULAR | Status: AC
  Filled 2022-05-11: qty 2

## 2022-05-11 MED ORDER — CEFAZOLIN SODIUM-DEXTROSE 2-4 GM/100ML-% IV SOLN
2.0000 g | INTRAVENOUS | Status: AC
  Administered 2022-05-11: 2 g via INTRAVENOUS

## 2022-05-11 MED ORDER — FENTANYL CITRATE (PF) 100 MCG/2ML IJ SOLN: 25.0000 ug | INTRAMUSCULAR | Status: DC | PRN

## 2022-05-11 MED ORDER — DEXAMETHASONE SODIUM PHOSPHATE 10 MG/ML IJ SOLN
INTRAMUSCULAR | Status: AC
  Filled 2022-05-11: qty 1

## 2022-05-11 MED ORDER — KETOROLAC TROMETHAMINE 30 MG/ML IJ SOLN
INTRAMUSCULAR | Status: AC
  Filled 2022-05-11: qty 1

## 2022-05-11 MED ORDER — ACETAMINOPHEN 500 MG PO TABS
1000.0000 mg | ORAL_TABLET | Freq: Once | ORAL | Status: AC
  Administered 2022-05-11: 1000 mg via ORAL

## 2022-05-11 MED ORDER — ACETAMINOPHEN 500 MG PO TABS: 1000.0000 mg | ORAL_TABLET | Freq: Three times a day (TID) | ORAL | 0 refills | Status: AC

## 2022-05-11 MED ORDER — OXYCODONE HCL 5 MG/5ML PO SOLN: 5.0000 mg | Freq: Once | ORAL | Status: DC | PRN

## 2022-05-11 MED ORDER — PHENYLEPHRINE HCL-NACL 20-0.9 MG/250ML-% IV SOLN
INTRAVENOUS | Status: DC | PRN
  Administered 2022-05-11: 25 ug/min via INTRAVENOUS

## 2022-05-11 MED ORDER — GABAPENTIN 100 MG PO CAPS: 100.0000 mg | ORAL_CAPSULE | Freq: Three times a day (TID) | ORAL | 0 refills | Status: AC

## 2022-05-11 MED ORDER — AMISULPRIDE (ANTIEMETIC) 5 MG/2ML IV SOLN
10.0000 mg | Freq: Once | INTRAVENOUS | Status: AC | PRN
  Administered 2022-05-11: 10 mg via INTRAVENOUS

## 2022-05-11 MED ORDER — BUPIVACAINE HCL (PF) 0.5 % IJ SOLN
INTRAMUSCULAR | Status: DC | PRN
  Administered 2022-05-11: 15 mL via PERINEURAL

## 2022-05-11 MED ORDER — MIDAZOLAM HCL 2 MG/2ML IJ SOLN
2.0000 mg | Freq: Once | INTRAMUSCULAR | Status: AC
  Administered 2022-05-11: 2 mg via INTRAVENOUS

## 2022-05-11 MED ORDER — FENTANYL CITRATE (PF) 100 MCG/2ML IJ SOLN
100.0000 ug | Freq: Once | INTRAMUSCULAR | Status: AC
  Administered 2022-05-11: 100 ug via INTRAVENOUS

## 2022-05-11 MED ORDER — DEXAMETHASONE SODIUM PHOSPHATE 10 MG/ML IJ SOLN
INTRAMUSCULAR | Status: DC | PRN
  Administered 2022-05-11: 5 mg via INTRAVENOUS

## 2022-05-11 MED ORDER — PHENYLEPHRINE HCL (PRESSORS) 10 MG/ML IV SOLN
INTRAVENOUS | Status: DC | PRN
  Administered 2022-05-11 (×2): 80 ug via INTRAVENOUS

## 2022-05-11 MED ORDER — OXYCODONE HCL 5 MG PO TABS: 5.0000 mg | ORAL_TABLET | Freq: Once | ORAL | Status: DC | PRN

## 2022-05-11 MED ORDER — ONDANSETRON HCL 4 MG/2ML IJ SOLN
INTRAMUSCULAR | Status: DC | PRN
  Administered 2022-05-11: 4 mg via INTRAVENOUS

## 2022-05-11 MED ORDER — ROCURONIUM BROMIDE 10 MG/ML (PF) SYRINGE
PREFILLED_SYRINGE | INTRAVENOUS | Status: AC
  Filled 2022-05-11: qty 10

## 2022-05-11 MED ORDER — CELECOXIB 100 MG PO CAPS: 100.0000 mg | ORAL_CAPSULE | Freq: Two times a day (BID) | ORAL | 0 refills | Status: AC

## 2022-05-11 MED ORDER — ONDANSETRON HCL 4 MG PO TABS: 4.0000 mg | ORAL_TABLET | Freq: Three times a day (TID) | ORAL | 0 refills | Status: AC | PRN

## 2022-05-11 MED ORDER — FENTANYL CITRATE (PF) 100 MCG/2ML IJ SOLN
INTRAMUSCULAR | Status: DC | PRN
  Administered 2022-05-11: 100 ug via INTRAVENOUS

## 2022-05-11 MED ORDER — PHENYLEPHRINE 80 MCG/ML (10ML) SYRINGE FOR IV PUSH (FOR BLOOD PRESSURE SUPPORT)
PREFILLED_SYRINGE | INTRAVENOUS | Status: AC
  Filled 2022-05-11: qty 10

## 2022-05-11 MED ORDER — CEFAZOLIN SODIUM-DEXTROSE 2-4 GM/100ML-% IV SOLN
INTRAVENOUS | Status: AC
  Filled 2022-05-11: qty 100

## 2022-05-11 MED ORDER — MIDAZOLAM HCL 2 MG/2ML IJ SOLN
INTRAMUSCULAR | Status: AC
  Filled 2022-05-11: qty 2

## 2022-05-11 MED ORDER — OXYCODONE HCL 5 MG PO TABS: ORAL_TABLET | ORAL | 0 refills | Status: AC

## 2022-05-11 MED ORDER — BUPIVACAINE LIPOSOME 1.3 % IJ SUSP
INTRAMUSCULAR | Status: DC | PRN
  Administered 2022-05-11: 10 mL via PERINEURAL

## 2022-05-11 MED ORDER — AMISULPRIDE (ANTIEMETIC) 5 MG/2ML IV SOLN
INTRAVENOUS | Status: AC
  Filled 2022-05-11: qty 4

## 2022-05-11 MED ORDER — GABAPENTIN 300 MG PO CAPS
300.0000 mg | ORAL_CAPSULE | Freq: Once | ORAL | Status: AC
  Administered 2022-05-11: 300 mg via ORAL

## 2022-05-11 MED ORDER — GABAPENTIN 300 MG PO CAPS
ORAL_CAPSULE | ORAL | Status: AC
  Filled 2022-05-11: qty 1

## 2022-05-11 MED ORDER — KETOROLAC TROMETHAMINE 30 MG/ML IJ SOLN
30.0000 mg | Freq: Once | INTRAMUSCULAR | Status: AC | PRN
  Administered 2022-05-11: 30 mg via INTRAVENOUS

## 2022-05-11 MED ORDER — LACTATED RINGERS IV SOLN: INTRAVENOUS | Status: DC

## 2022-05-11 MED ORDER — SUGAMMADEX SODIUM 200 MG/2ML IV SOLN
INTRAVENOUS | Status: DC | PRN
  Administered 2022-05-11: 300 mg via INTRAVENOUS

## 2022-05-11 MED ORDER — LIDOCAINE 2% (20 MG/ML) 5 ML SYRINGE
INTRAMUSCULAR | Status: AC
  Filled 2022-05-11: qty 5

## 2022-05-11 SURGICAL SUPPLY — 52 items
AID PSTN UNV HD RSTRNT DISP (MISCELLANEOUS) ×1
APL PRP STRL LF DISP 70% ISPRP (MISCELLANEOUS) ×1
BLADE EXCALIBUR 4.0X13 (MISCELLANEOUS) ×1 IMPLANT
BURR OVAL 8 FLU 4.0X13 (MISCELLANEOUS) ×1 IMPLANT
CANNULA 5.75X71 LONG (CANNULA) IMPLANT
CANNULA PASSPORT 5 (CANNULA) IMPLANT
CANNULA PASSPORT BUTTON 10-40 (CANNULA) IMPLANT
CANNULA TWIST IN 8.25X7CM (CANNULA) IMPLANT
CHLORAPREP W/TINT 26 (MISCELLANEOUS) ×1 IMPLANT
CLSR STERI-STRIP ANTIMIC 1/2X4 (GAUZE/BANDAGES/DRESSINGS) ×1 IMPLANT
COOLER ICEMAN CLASSIC (MISCELLANEOUS) ×1 IMPLANT
DRAPE IMP U-DRAPE 54X76 (DRAPES) ×1 IMPLANT
DRAPE INCISE IOBAN 66X45 STRL (DRAPES) IMPLANT
DRAPE SHOULDER BEACH CHAIR (DRAPES) ×1 IMPLANT
DW OUTFLOW CASSETTE/TUBE SET (MISCELLANEOUS) ×1 IMPLANT
GAUZE PAD ABD 8X10 STRL (GAUZE/BANDAGES/DRESSINGS) ×1 IMPLANT
GAUZE SPONGE 4X4 12PLY STRL (GAUZE/BANDAGES/DRESSINGS) ×1 IMPLANT
GLOVE BIO SURGEON STRL SZ 6.5 (GLOVE) ×1 IMPLANT
GLOVE BIOGEL PI IND STRL 6.5 (GLOVE) ×1 IMPLANT
GLOVE BIOGEL PI IND STRL 8 (GLOVE) ×1 IMPLANT
GLOVE ECLIPSE 8.0 STRL XLNG CF (GLOVE) ×1 IMPLANT
GOWN STRL REUS W/ TWL LRG LVL3 (GOWN DISPOSABLE) ×2 IMPLANT
GOWN STRL REUS W/TWL LRG LVL3 (GOWN DISPOSABLE) ×1
GOWN STRL REUS W/TWL XL LVL3 (GOWN DISPOSABLE) ×1 IMPLANT
KIT STABILIZATION SHOULDER (MISCELLANEOUS) ×1 IMPLANT
KIT STR SPEAR 1.8 FBRTK DISP (KITS) IMPLANT
LASSO CRESCENT QUICKPASS (SUTURE) IMPLANT
MANIFOLD NEPTUNE II (INSTRUMENTS) ×1 IMPLANT
NDL SAFETY ECLIP 18X1.5 (MISCELLANEOUS) ×1 IMPLANT
NDL SCORPION MULTI FIRE (NEEDLE) IMPLANT
NEEDLE SCORPION MULTI FIRE (NEEDLE) IMPLANT
PACK ARTHROSCOPY DSU (CUSTOM PROCEDURE TRAY) ×1 IMPLANT
PACK BASIN DAY SURGERY FS (CUSTOM PROCEDURE TRAY) ×1 IMPLANT
PAD COLD SHLDR WRAP-ON (PAD) ×1 IMPLANT
PORT APPOLLO RF 90DEGREE MULTI (SURGICAL WAND) ×1 IMPLANT
RESTRAINT HEAD UNIVERSAL NS (MISCELLANEOUS) ×1 IMPLANT
SHEET MEDIUM DRAPE 40X70 STRL (DRAPES) ×1 IMPLANT
SLEEVE SCD COMPRESS KNEE MED (STOCKING) ×1 IMPLANT
SLING ARM FOAM STRAP LRG (SOFTGOODS) IMPLANT
SUT FIBERWIRE #2 38 T-5 BLUE (SUTURE)
SUT MNCRL AB 4-0 PS2 18 (SUTURE) ×1 IMPLANT
SUT PDS AB 1 CT  36 (SUTURE)
SUT PDS AB 1 CT 36 (SUTURE) IMPLANT
SUT TIGER TAPE 7 IN WHITE (SUTURE) IMPLANT
SUTURE FIBERWR #2 38 T-5 BLUE (SUTURE) IMPLANT
SUTURE TAPE TIGERLINK 1.3MM BL (SUTURE) IMPLANT
SUTURETAPE TIGERLINK 1.3MM BL (SUTURE)
SYR 5ML LL (SYRINGE) ×1 IMPLANT
TAPE FIBER 2MM 7IN #2 BLUE (SUTURE) IMPLANT
TOWEL GREEN STERILE FF (TOWEL DISPOSABLE) ×2 IMPLANT
TUBE CONNECTING 20X1/4 (TUBING) ×1 IMPLANT
TUBING ARTHROSCOPY IRRIG 16FT (MISCELLANEOUS) ×1 IMPLANT

## 2022-05-11 NOTE — Anesthesia Procedure Notes (Signed)
Anesthesia Regional Block: Interscalene brachial plexus block   Pre-Anesthetic Checklist: , timeout performed,  Correct Patient, Correct Site, Correct Laterality,  Correct Procedure, Correct Position, site marked,  Risks and benefits discussed,  Surgical consent,  Pre-op evaluation,  At surgeon's request and post-op pain management  Laterality: Left  Prep: chloraprep       Needles:  Injection technique: Single-shot  Needle Type: Echogenic Stimulator Needle     Needle Length: 9cm  Needle Gauge: 21     Additional Needles:   Procedures:,,,, ultrasound used (permanent image in chart),,    Narrative:  Start time: 05/11/2022 10:50 AM End time: 05/11/2022 11:00 AM Injection made incrementally with aspirations every 5 mL.  Performed by: Personally  Anesthesiologist: Leonides Grills, MD  Additional Notes: Functioning IV was confirmed and monitors were applied.  A timeout was performed. Sterile prep, hand hygiene and sterile gloves were used. A 45mm 21ga Arrow echogenic stimulator needle was used. Negative aspiration and negative test dose prior to incremental administration of local anesthetic. The patient tolerated the procedure well.  Ultrasound guidance: relevent anatomy identified, needle position confirmed, local anesthetic spread visualized around nerve(s), vascular puncture avoided.  Image printed for medical record.

## 2022-05-11 NOTE — Progress Notes (Signed)
Assisted Dr. Ellender with left, interscalene, ultrasound guided block. Side rails up, monitors on throughout procedure. See vital signs in flow sheet. Tolerated Procedure well. 

## 2022-05-11 NOTE — Op Note (Signed)
Orthopaedic Surgery Operative Note (CSN: [id number])  Debra Massey  Sep 04, 1970 Date of Surgery: 05/11/2022   DIAGNOSES: Left shoulder,  arthrofibrosis .  POST-OPERATIVE DIAGNOSIS: same  PROCEDURE: Arthroscopic extensive debridement - [place], Supraspinatus Tendon, Anterior Labrum, and capsule and rotator interval Lysis of adhesions and manipulation under anesthesia   OPERATIVE FINDING: Rotator cuff was essentially normal but there was some scarring superiorly on the bursal side that was debrided.  Articular surfaces were normal, there was definitively no SLAP tear.  Biceps was intact but had some red injected inflammation within it.  Subscapularis normal, supraspinatus normal articular side.  Patient was extremely tight with preoperative external rotation to 30 and forward flexion to 90.     Post-operative plan: The patient will be non-weightbearing in a sling for 1 to 2 days until nerve block wears off.  The patient will be discharged home.  DVT prophylaxis not indicated in ambulatory upper extremity patient without known risk factors.   Pain control with PRN pain medication preferring oral medicines.  Follow up plan will be scheduled in approximately 7 days for incision check.  Surgeons:Primary: Hiram Gash, MD Assistants:Caroline McBane PA-C Location: [facility] OR ROOM 6 Anesthesia: General with Exparel interscalene block Antibiotics: Ancef 2 g Tourniquet time: None Estimated Blood Loss: Minimal Complications: None Specimens: None Implants: * No implants in log *  Indications for Surgery:   Debra Massey is a 51 y.o. female with continued shoulder pain refractory to nonoperative measures for extended period of time.  Arthrofibrosis was the working diagnosis.  She had had a scope before.  The risks and benefits were explained at length including but not limited to continued pain, cuff failure, biceps tenodesis failure, stiffness, need for further surgery and  infection.   Procedure:   Patient was correctly identified in the preoperative holding area and operative site marked.  Patient brought to OR and positioned beachchair on an [place] table ensuring that all bony prominences were padded and the head was in an appropriate location.  Anesthesia was induced and the operative shoulder was prepped and draped in the usual sterile fashion.  Timeout was called preincision.  A standard posterior viewing portal was made after localizing the portal with a spinal needle.  An anterior accessory portal was also made.  After clearing the articular space the camera was positioned in the subacromial space.  Findings above.    Extensive debridement was performed of the anterior interval tissue, labral fraying and the bursa.  We skeletonized the rotator interval and performed extensive debridement of this area.  Capsule and supraspinatus tendon were also debrided in the bursal side.  We identified the thickened MGHL and released this with a RF ablator.  We then used the RF ablator to skeletonize the anterior interval.  Once was complete we used a combination of the RF ablator as well as the basket device to complete a capsular release along the inferior half of the glenoid just off the glenoid labrum.  We stayed close to the labrum to avoid neurovascular damage.  At that point we removed all instruments and were able to manipulate the shoulder and demonstrate that there was improvement in range of motion as documented above.  We placed the scope back into the shoulder to ensure that there were no damage to the cuff or other structures in the shoulder.  Made an accessory lateral portal and went to the subacromial space and perform extensive debridement in this area as well as removing bursa, scarring and  debriding some of the fraying of the supraspinatus   The incisions were closed with absorbable monocryl and steri strips.  A sterile dressing was placed along with a sling.  The patient was awoken from general anesthesia and taken to the PACU in stable condition without complication.   Alfonse Alpers, PA-C, present and scrubbed throughout the case, critical for completion in a timely fashion, and for retraction, instrumentation, closure.

## 2022-05-11 NOTE — Transfer of Care (Signed)
Immediate Anesthesia Transfer of Care Note  Patient: Debra Massey  Procedure(s) Performed: ARTHROSCOPY SHOULDER (Left: Shoulder)  Patient Location: PACU  Anesthesia Type:GA combined with regional for post-op pain  Level of Consciousness: awake, alert , oriented, and patient cooperative  Airway & Oxygen Therapy: Patient Spontanous Breathing and Patient connected to face mask oxygen  Post-op Assessment: Report given to RN and Post -op Vital signs reviewed and stable  Post vital signs: Reviewed and stable  Last Vitals:  Vitals Value Taken Time  BP    Temp    Pulse 89 05/11/22 1222  Resp    SpO2 98 % 05/11/22 1222  Vitals shown include unvalidated device data.  Last Pain:  Vitals:   05/11/22 0918  TempSrc: Oral  PainSc: 0-No pain      Patients Stated Pain Goal: 5 (05/11/22 8206)  Complications: No notable events documented.

## 2022-05-11 NOTE — Interval H&P Note (Signed)
All questions answered, patient wants to proceed with procedure. ? ?

## 2022-05-11 NOTE — Anesthesia Postprocedure Evaluation (Signed)
Anesthesia Post Note  Patient: Debra Massey  Procedure(s) Performed: ARTHROSCOPY SHOULDER (Left: Shoulder)     Patient location during evaluation: PACU Anesthesia Type: Regional and General Level of consciousness: awake Pain management: pain level controlled Vital Signs Assessment: post-procedure vital signs reviewed and stable Respiratory status: spontaneous breathing, nonlabored ventilation and respiratory function stable Cardiovascular status: blood pressure returned to baseline and stable Postop Assessment: no apparent nausea or vomiting Anesthetic complications: no   No notable events documented.  Last Vitals:  Vitals:   05/11/22 1230 05/11/22 1312  BP: 118/75 131/87  Pulse: 65 67  Resp: 20 16  Temp:  36.4 C  SpO2: 95% 95%    Last Pain:  Vitals:   05/11/22 1312  TempSrc:   PainSc: 0-No pain                 Alysiana Ethridge P Elzena Muston

## 2022-05-11 NOTE — Anesthesia Preprocedure Evaluation (Addendum)
Anesthesia Evaluation  Patient identified by MRN, date of birth, ID band Patient awake    Reviewed: Allergy & Precautions, NPO status , Patient's Chart, lab work & pertinent test results  Airway Mallampati: II  TM Distance: >3 FB Neck ROM: Full    Dental no notable dental hx.    Pulmonary former smoker   Pulmonary exam normal        Cardiovascular negative cardio ROS Normal cardiovascular exam     Neuro/Psych  Headaches PSYCHIATRIC DISORDERS Anxiety      Neuromuscular disease    GI/Hepatic negative GI ROS, Neg liver ROS,,,  Endo/Other  negative endocrine ROS    Renal/GU negative Renal ROS     Musculoskeletal  (+) Arthritis ,    Abdominal   Peds  (+) ATTENTION DEFICIT DISORDER WITHOUT HYPERACTIVITY Hematology negative hematology ROS (+)   Anesthesia Other Findings left shoulder cartilage disorger,adhesive capsulitits  Reproductive/Obstetrics                             Anesthesia Physical Anesthesia Plan  ASA: 2  Anesthesia Plan: Regional and General   Post-op Pain Management:    Induction: Intravenous  PONV Risk Score and Plan: 3 and Ondansetron, Dexamethasone, Midazolam and Treatment may vary due to age or medical condition  Airway Management Planned: Oral ETT  Additional Equipment:   Intra-op Plan:   Post-operative Plan: Extubation in OR  Informed Consent: I have reviewed the patients History and Physical, chart, labs and discussed the procedure including the risks, benefits and alternatives for the proposed anesthesia with the patient or authorized representative who has indicated his/her understanding and acceptance.     Dental advisory given  Plan Discussed with: CRNA  Anesthesia Plan Comments:        Anesthesia Quick Evaluation

## 2022-05-11 NOTE — Anesthesia Procedure Notes (Signed)
Procedure Name: Intubation Date/Time: 05/11/2022 11:45 AM  Performed by: Lavonia Dana, CRNAPre-anesthesia Checklist: Patient identified, Emergency Drugs available, Suction available and Patient being monitored Patient Re-evaluated:Patient Re-evaluated prior to induction Oxygen Delivery Method: Circle system utilized Preoxygenation: Pre-oxygenation with 100% oxygen Induction Type: IV induction Ventilation: Mask ventilation without difficulty Laryngoscope Size: Mac and 3 Grade View: Grade I Tube type: Oral Tube size: 7.0 mm Number of attempts: 1 Airway Equipment and Method: Stylet and Bite block Placement Confirmation: ETT inserted through vocal cords under direct vision, positive ETCO2 and breath sounds checked- equal and bilateral Secured at: 21 cm Tube secured with: Tape Dental Injury: Teeth and Oropharynx as per pre-operative assessment

## 2022-05-12 ENCOUNTER — Encounter (HOSPITAL_BASED_OUTPATIENT_CLINIC_OR_DEPARTMENT_OTHER): Payer: Self-pay | Admitting: Orthopaedic Surgery

## 2022-05-12 DIAGNOSIS — M25612 Stiffness of left shoulder, not elsewhere classified: Secondary | ICD-10-CM | POA: Diagnosis not present

## 2022-05-12 DIAGNOSIS — M7502 Adhesive capsulitis of left shoulder: Secondary | ICD-10-CM | POA: Diagnosis not present

## 2022-05-13 DIAGNOSIS — M7502 Adhesive capsulitis of left shoulder: Secondary | ICD-10-CM | POA: Diagnosis not present

## 2022-05-13 DIAGNOSIS — M25612 Stiffness of left shoulder, not elsewhere classified: Secondary | ICD-10-CM | POA: Diagnosis not present

## 2022-05-14 DIAGNOSIS — M25612 Stiffness of left shoulder, not elsewhere classified: Secondary | ICD-10-CM | POA: Diagnosis not present

## 2022-05-14 DIAGNOSIS — M7502 Adhesive capsulitis of left shoulder: Secondary | ICD-10-CM | POA: Diagnosis not present

## 2022-05-19 DIAGNOSIS — M25612 Stiffness of left shoulder, not elsewhere classified: Secondary | ICD-10-CM | POA: Diagnosis not present

## 2022-05-19 DIAGNOSIS — M7502 Adhesive capsulitis of left shoulder: Secondary | ICD-10-CM | POA: Diagnosis not present

## 2022-05-20 DIAGNOSIS — M7502 Adhesive capsulitis of left shoulder: Secondary | ICD-10-CM | POA: Diagnosis not present

## 2022-05-24 DIAGNOSIS — M25612 Stiffness of left shoulder, not elsewhere classified: Secondary | ICD-10-CM | POA: Diagnosis not present

## 2022-05-24 DIAGNOSIS — M7502 Adhesive capsulitis of left shoulder: Secondary | ICD-10-CM | POA: Diagnosis not present

## 2022-05-26 DIAGNOSIS — M25612 Stiffness of left shoulder, not elsewhere classified: Secondary | ICD-10-CM | POA: Diagnosis not present

## 2022-05-26 DIAGNOSIS — M7502 Adhesive capsulitis of left shoulder: Secondary | ICD-10-CM | POA: Diagnosis not present

## 2022-06-01 ENCOUNTER — Ambulatory Visit (INDEPENDENT_AMBULATORY_CARE_PROVIDER_SITE_OTHER): Payer: BC Managed Care – PPO | Admitting: Sports Medicine

## 2022-06-01 DIAGNOSIS — M25612 Stiffness of left shoulder, not elsewhere classified: Secondary | ICD-10-CM | POA: Diagnosis not present

## 2022-06-01 DIAGNOSIS — G8929 Other chronic pain: Secondary | ICD-10-CM

## 2022-06-01 DIAGNOSIS — M25512 Pain in left shoulder: Secondary | ICD-10-CM | POA: Diagnosis not present

## 2022-06-01 DIAGNOSIS — M7502 Adhesive capsulitis of left shoulder: Secondary | ICD-10-CM | POA: Diagnosis not present

## 2022-06-01 NOTE — Assessment & Plan Note (Signed)
Debra Massey returns, she is a very pleasant 52 year old female, she has had a very complex process with her shoulder, chronic shoulder pain, left-sided, she ultimately had arthroscopic debridement with Dr. Alesia Richards, acromioplasty, cuff and bursal debridement, labral debridement approximately 7 months ago. She had persistent pain over the deltoid, anterior and posterior joint lines, significant loss of external rotation all consistent with adhesive capsulitis, she did not really have any physical therapy after her shoulder arthroscopy. She saw her surgeon again had a subacromial injection and had no improvement. We obtained a CT arthrogram that showed potentially some labral fraying and potentially a SLAP tear, cuff fraying. We had her see Dr. Griffin Basil, she had severe pain with loss of motion as can be seen in inflammatory phase of adhesive capsulitis, he referred her back to [place] for a glenohumeral injection to calm things down before surgical intervention. We did her injection on December 5, she had arthroscopic debridement on December 26, capsular release, subacromial debridement. She returns today completely pain-free with full range of motion, she can return to see [place] as needed, I think she is okay to return to work at this point.

## 2022-06-01 NOTE — Progress Notes (Signed)
    Procedures performed today:    None.  Independent interpretation of notes and tests performed by another provider:   None.  Brief History, Exam, Impression, and Recommendations:    Chronic left shoulder pain Debra Massey returns, she is a very pleasant 52 year old female, she has had a very complex process with her shoulder, chronic shoulder pain, left-sided, she ultimately had arthroscopic debridement with Dr. Alesia Richards, acromioplasty, cuff and bursal debridement, labral debridement approximately 7 months ago. She had persistent pain over the deltoid, anterior and posterior joint lines, significant loss of external rotation all consistent with adhesive capsulitis, she did not really have any physical therapy after her shoulder arthroscopy. She saw her surgeon again had a subacromial injection and had no improvement. We obtained a CT arthrogram that showed potentially some labral fraying and potentially a SLAP tear, cuff fraying. We had her see Dr. Griffin Basil, she had severe pain with loss of motion as can be seen in inflammatory phase of adhesive capsulitis, he referred her back to [place] for a glenohumeral injection to calm things down before surgical intervention. We did her injection on December 5, she had arthroscopic debridement on December 26, capsular release, subacromial debridement. She returns today completely pain-free with full range of motion, she can return to see [place] as needed, I think she is okay to return to work at this point.    ____________________________________________ Gwen Her. Dianah Field, M.D., ABFM., CAQSM., AME. Primary Care and Sports Medicine [facility]  Adjunct Professor of [place] [employer] [facility] [employer]  Risk manager

## 2022-06-03 DIAGNOSIS — M7502 Adhesive capsulitis of left shoulder: Secondary | ICD-10-CM | POA: Diagnosis not present

## 2022-06-03 DIAGNOSIS — M25612 Stiffness of left shoulder, not elsewhere classified: Secondary | ICD-10-CM | POA: Diagnosis not present

## 2022-08-30 ENCOUNTER — Encounter: Payer: Self-pay | Admitting: *Deleted

## 2022-09-08 ENCOUNTER — Encounter (INDEPENDENT_AMBULATORY_CARE_PROVIDER_SITE_OTHER): Payer: BC Managed Care – PPO | Admitting: Sports Medicine

## 2022-09-08 DIAGNOSIS — G8929 Other chronic pain: Secondary | ICD-10-CM

## 2022-09-08 DIAGNOSIS — M25512 Pain in left shoulder: Secondary | ICD-10-CM | POA: Diagnosis not present

## 2022-09-08 MED ORDER — PREDNISONE 50 MG PO TABS: 50.0000 mg | ORAL_TABLET | Freq: Every day | ORAL | 0 refills | Status: DC

## 2022-09-08 NOTE — Telephone Encounter (Signed)
I spent 5 total minutes of online digital evaluation and management services in this patient-initiated request for online care. 

## 2022-11-01 ENCOUNTER — Ambulatory Visit (INDEPENDENT_AMBULATORY_CARE_PROVIDER_SITE_OTHER): Payer: BC Managed Care – PPO

## 2022-11-01 ENCOUNTER — Ambulatory Visit (INDEPENDENT_AMBULATORY_CARE_PROVIDER_SITE_OTHER): Payer: BC Managed Care – PPO | Admitting: Sports Medicine

## 2022-11-01 ENCOUNTER — Encounter: Payer: Self-pay | Admitting: Sports Medicine

## 2022-11-01 DIAGNOSIS — Z0189 Encounter for other specified special examinations: Secondary | ICD-10-CM

## 2022-11-01 DIAGNOSIS — M25562 Pain in left knee: Secondary | ICD-10-CM

## 2022-11-01 DIAGNOSIS — M25561 Pain in right knee: Secondary | ICD-10-CM | POA: Diagnosis not present

## 2022-11-01 MED ORDER — DICLOFENAC SODIUM 1 % EX GEL: 4.0000 g | Freq: Four times a day (QID) | CUTANEOUS | 11 refills | Status: DC

## 2022-11-01 NOTE — Assessment & Plan Note (Signed)
This is a pleasant 52 year old female, she has had left knee pain on and off for years, predominantly left knee posterior/medial joint. Her exam is completely normal today. Suspect early osteoarthritis, she will restart Celebrex and do it twice a day, adding topical Voltaren, x-rays, home physical therapy, will do this for 4-6 weeks, and then get a knee MRI and do an injection if not better.

## 2022-11-01 NOTE — Progress Notes (Signed)
    Procedures performed today:    None.  Independent interpretation of notes and tests performed by another provider:   None.  Brief History, Exam, Impression, and Recommendations:    Posterior left knee pain This is a pleasant 52 year old female, she has had left knee pain on and off for years, predominantly left knee posterior/medial joint. Her exam is completely normal today. Suspect early osteoarthritis, she will restart Celebrex and do it twice a day, adding topical Voltaren, x-rays, home physical therapy, will do this for 4-6 weeks, and then get a knee MRI and do an injection if not better.    ____________________________________________ Ihor Austin. Benjamin Stain, M.D., ABFM., CAQSM., AME. Primary Care and Sports Medicine [facility]  Adjunct Professor of Family Medicine  [place] [employer]  Restaurant manager, fast food

## 2022-11-29 ENCOUNTER — Encounter: Payer: Self-pay | Admitting: Physician Assistant

## 2022-11-29 DIAGNOSIS — F410 Panic disorder [episodic paroxysmal anxiety] without agoraphobia: Secondary | ICD-10-CM

## 2022-11-29 DIAGNOSIS — F411 Generalized anxiety disorder: Secondary | ICD-10-CM

## 2022-11-30 ENCOUNTER — Ambulatory Visit: Payer: BC Managed Care – PPO | Admitting: Sports Medicine

## 2022-12-03 ENCOUNTER — Encounter (INDEPENDENT_AMBULATORY_CARE_PROVIDER_SITE_OTHER): Payer: BC Managed Care – PPO | Admitting: Sports Medicine

## 2022-12-03 DIAGNOSIS — S83282A Other tear of lateral meniscus, current injury, left knee, initial encounter: Secondary | ICD-10-CM | POA: Diagnosis not present

## 2022-12-03 NOTE — Telephone Encounter (Signed)
Please see the MyChart message reply(ies) for my assessment and plan.    This patient gave consent for this Medical Advice Message and is aware that it may result in a bill to their insurance company, as well as the possibility of receiving a bill for a co-payment or deductible. They are an established patient, but are not seeking medical advice exclusively about a problem treated during an in person or video visit in the last seven days. I did not recommend an in person or video visit within seven days of my reply.    I spent a total of 5 minutes cumulative time within 7 days through MyChart messaging.  Thomas Thekkekandam, MD   

## 2022-12-18 ENCOUNTER — Other Ambulatory Visit: Payer: BC Managed Care – PPO

## 2022-12-28 ENCOUNTER — Encounter: Payer: Self-pay | Admitting: Behavioral Health

## 2022-12-28 ENCOUNTER — Ambulatory Visit (INDEPENDENT_AMBULATORY_CARE_PROVIDER_SITE_OTHER): Payer: BC Managed Care – PPO | Admitting: Behavioral Health

## 2022-12-28 DIAGNOSIS — F411 Generalized anxiety disorder: Secondary | ICD-10-CM

## 2022-12-28 DIAGNOSIS — F331 Major depressive disorder, recurrent, moderate: Secondary | ICD-10-CM

## 2022-12-28 NOTE — Progress Notes (Signed)
                Craig M Peters, LCMHC 
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
## 2023-01-21 ENCOUNTER — Encounter: Payer: Self-pay | Admitting: Physician Assistant

## 2023-01-21 DIAGNOSIS — U071 COVID-19: Secondary | ICD-10-CM

## 2023-01-21 MED ORDER — NIRMATRELVIR/RITONAVIR (PAXLOVID)TABLET: 3.0000 | ORAL_TABLET | Freq: Two times a day (BID) | ORAL | 0 refills | Status: AC

## 2023-01-25 ENCOUNTER — Ambulatory Visit: Payer: BC Managed Care – PPO | Admitting: Behavioral Health

## 2023-02-08 ENCOUNTER — Ambulatory Visit: Payer: BC Managed Care – PPO | Admitting: Behavioral Health

## 2023-03-11 IMAGING — CT CT SHOULDER*L* W/CM
3 of 10 series · 13 of 35 positions shown, 15 images · non-contrast
Comparison: Left shoulder radiograph 12/26/2020.

CLINICAL DATA: Shoulder pain, rotator cuff disorder suspected, xray
done CT arthrography left shoulder, patient should be placed on my
schedule 30 minutes to 1 hour prior to the CT scan for contrast
injection.

EXAM:
CT ARTHROGRAPHY OF THE left shoulder
TECHNIQUE: Multidetector CT imaging was performed following the standard
protocol after injection of dilute contrast into the joint.
RADIATION DOSE REDUCTION: This exam was performed according to the
departmental dose-optimization program which includes automated
exposure control, adjustment of the mA and/or kV according to
patient size and/or use of iterative reconstruction technique.

[Series 6: ax bone · axial · 0.39mm/px · z∈[-192,-57]mm · 5 of 102 slices shown, 7 images]
[im 17/102  soft-tissue]
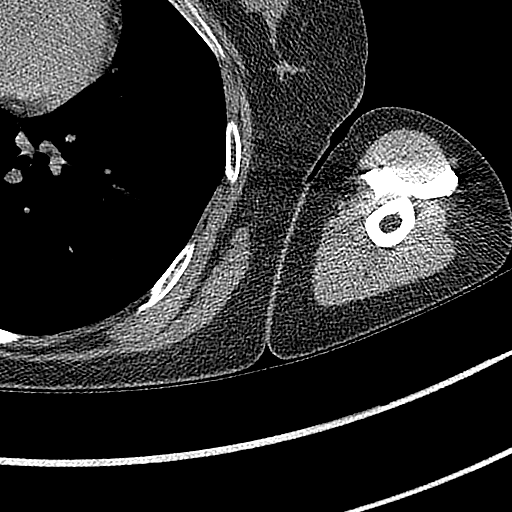
[im 17/102  bone]
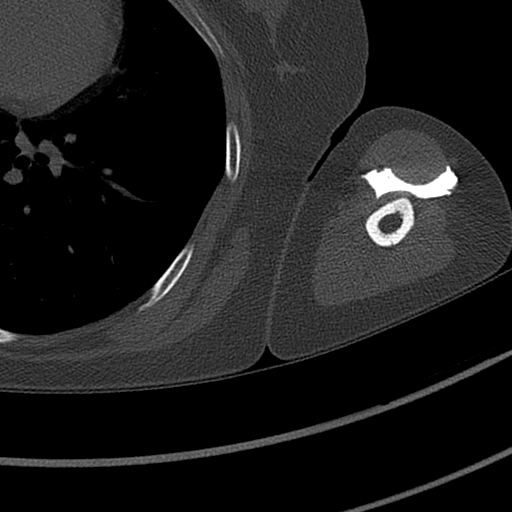
[im 34/102  bone]
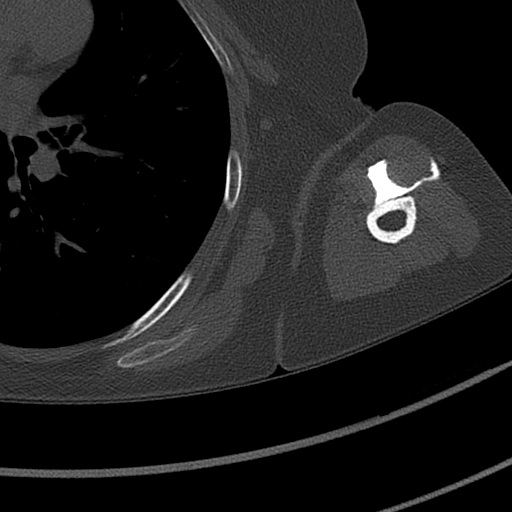
[im 51/102  bone]
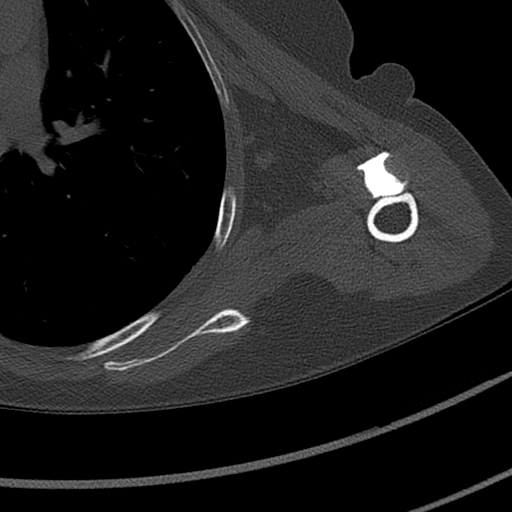
[im 68/102  bone]
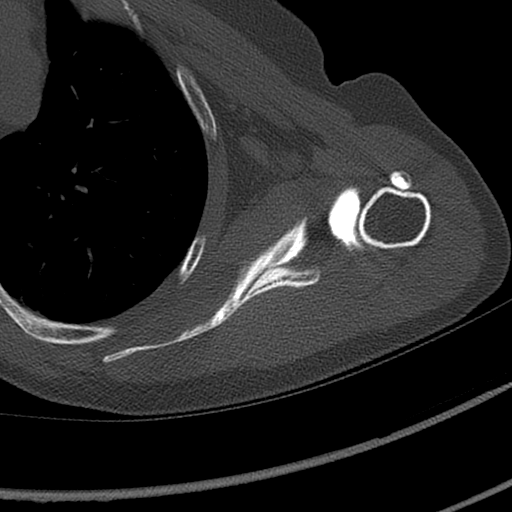
[im 85/102  soft-tissue]
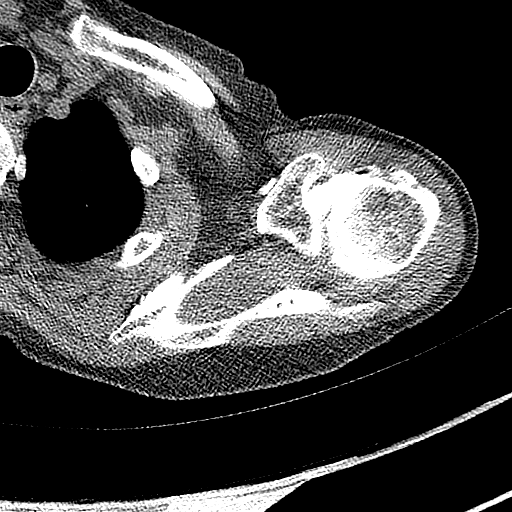
[im 85/102  bone]
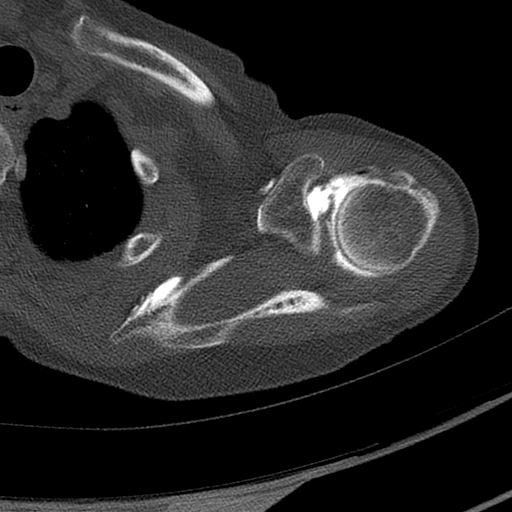

[Series 9: ax st · axial · 0.39mm/px · z∈[-192,-57]mm · 5 of 102 slices shown]
[im 17/102  bone]
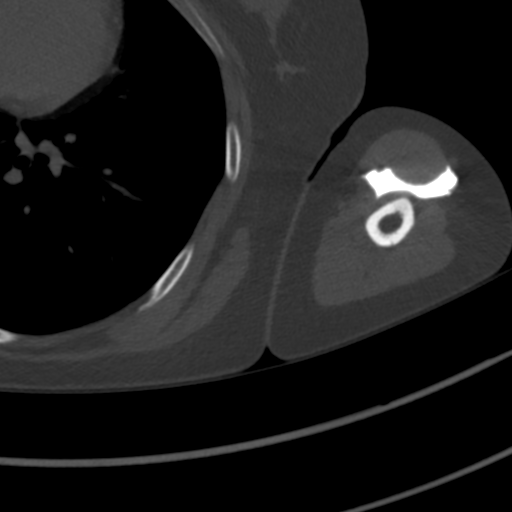
[im 34/102  bone]
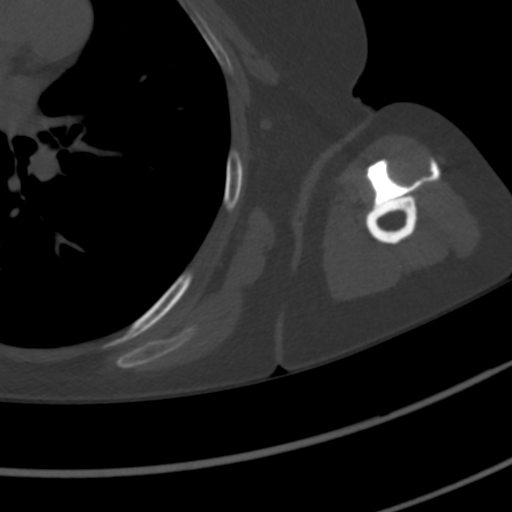
[im 51/102  bone]
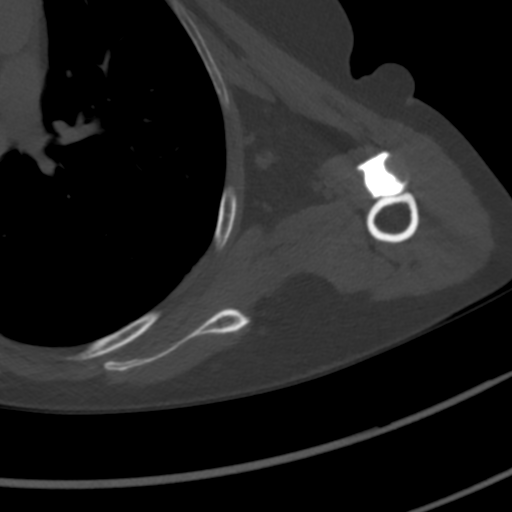
[im 68/102  bone]
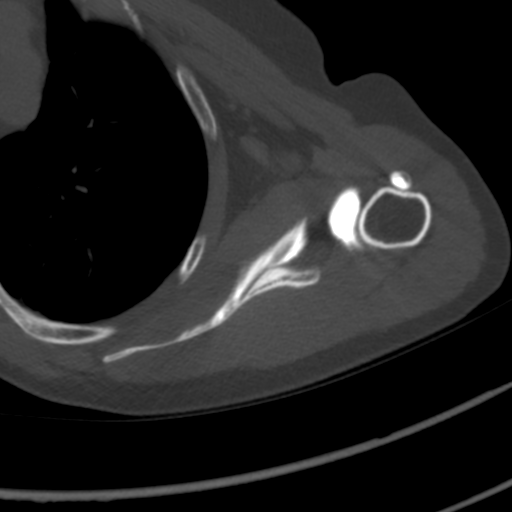
[im 85/102  bone]
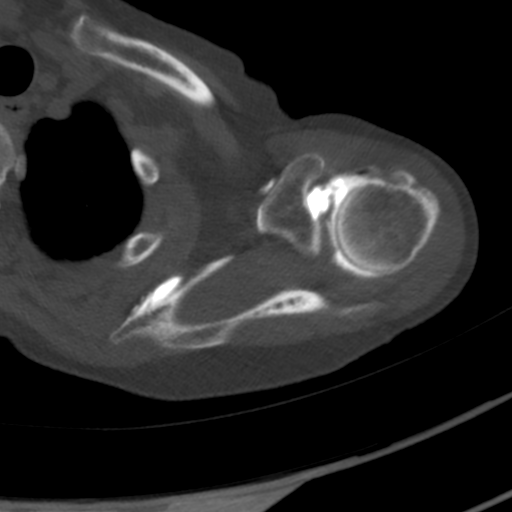

[Series 11: sag st · sagittal · 0.40mm/px · 3 of 121 slices shown]
[im 41/121  bone]
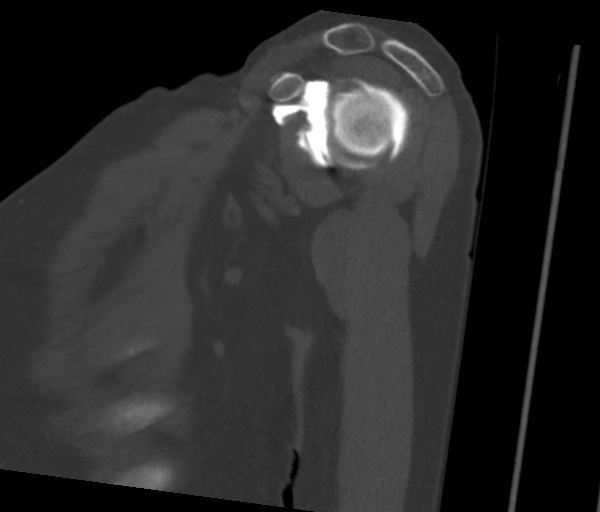
[im 53/121  soft-tissue]
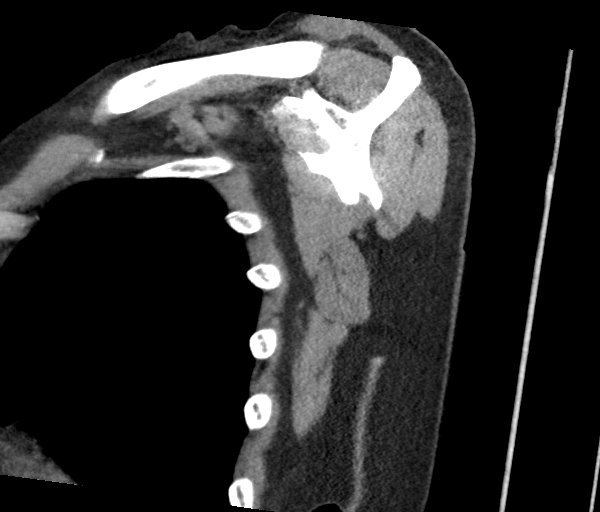
[im 81/121  bone]
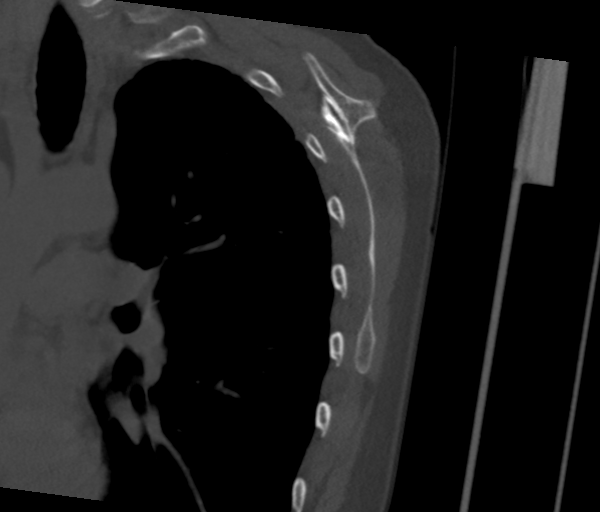

[13 of 35 positions shown; findings below may reference images not displayed]

FINDINGS: Rotator cuff: Supraspinatus tendon is intact. Infraspinatus tendon
is intact. Teres minor tendon is intact. There is articular sided
fraying and low-grade tearing at the distal mid to superior
subscapularis tendon at the footprint.

Muscles: No significant muscle atrophy.

Biceps Long Head: Intraarticular and extraarticular portions of the
biceps tendon are intact.

Acromioclavicular Joint: No significant arthropathy of the
acromioclavicular joint. No significant subacromial/subdeltoid
bursal fluid.

Glenohumeral Joint: Adequatedistension of the glenohumeral joint. No
focal cartilage defect.

Labrum: There is a normal variant sublabral recess of the superior
labrum. There is slight undercutting of the posteroinferior labrum
at [DATE].

Bones: No fracture or dislocation. No aggressive osseous lesion.

Other: No fluid collection or hematoma. There is partially
visualized bronchiectasis and reticulonodular opacities in the
lingula.
IMPRESSION: Articular sided fraying and low-grade tearing of the mid to superior
subscapularis tendon at the footprint. No high-grade or retracted
cuff tear. No muscle atrophy.

Slight undercutting of the posteroinferior labrum at [DATE] consistent
with small focal labral tear.

Intact biceps tendon.  No acute osseous abnormality.

Partially visualized bronchiectasis in reticulonodular opacities in
the lingula can be seen with a chronic atypical infectious process.

## 2023-04-11 ENCOUNTER — Ambulatory Visit (INDEPENDENT_AMBULATORY_CARE_PROVIDER_SITE_OTHER): Payer: BC Managed Care – PPO | Admitting: Physician Assistant

## 2023-04-11 ENCOUNTER — Encounter: Payer: Self-pay | Admitting: Physician Assistant

## 2023-04-11 VITALS — BP 139/74 | HR 65 | Resp 20 | Ht 63.0 in | Wt 144.0 lb

## 2023-04-11 DIAGNOSIS — F411 Generalized anxiety disorder: Secondary | ICD-10-CM

## 2023-04-11 DIAGNOSIS — Z1231 Encounter for screening mammogram for malignant neoplasm of breast: Secondary | ICD-10-CM | POA: Diagnosis not present

## 2023-04-11 DIAGNOSIS — Z87891 Personal history of nicotine dependence: Secondary | ICD-10-CM | POA: Insufficient documentation

## 2023-04-11 DIAGNOSIS — Z0000 Encounter for general adult medical examination without abnormal findings: Secondary | ICD-10-CM | POA: Diagnosis not present

## 2023-04-11 DIAGNOSIS — Z13220 Encounter for screening for lipoid disorders: Secondary | ICD-10-CM

## 2023-04-11 DIAGNOSIS — Z23 Encounter for immunization: Secondary | ICD-10-CM | POA: Diagnosis not present

## 2023-04-11 DIAGNOSIS — H9191 Unspecified hearing loss, right ear: Secondary | ICD-10-CM | POA: Insufficient documentation

## 2023-04-11 DIAGNOSIS — E559 Vitamin D deficiency, unspecified: Secondary | ICD-10-CM

## 2023-04-11 DIAGNOSIS — F17200 Nicotine dependence, unspecified, uncomplicated: Secondary | ICD-10-CM

## 2023-04-11 DIAGNOSIS — Z1211 Encounter for screening for malignant neoplasm of colon: Secondary | ICD-10-CM

## 2023-04-11 DIAGNOSIS — Z131 Encounter for screening for diabetes mellitus: Secondary | ICD-10-CM | POA: Diagnosis not present

## 2023-04-11 DIAGNOSIS — R454 Irritability and anger: Secondary | ICD-10-CM

## 2023-04-11 MED ORDER — DESVENLAFAXINE SUCCINATE ER 50 MG PO TB24: 50.0000 mg | ORAL_TABLET | Freq: Every day | ORAL | 3 refills | Status: DC

## 2023-04-11 NOTE — Patient Instructions (Signed)

## 2023-04-11 NOTE — Progress Notes (Signed)
Complete physical exam  Patient: Debra Massey   DOB: 21-Apr-1971   52 y.o. Female  MRN: [id number]  Subjective:    Chief Complaint  Patient presents with   Annual Exam    Debra Massey is a 52 y.o. female who presents today for a complete physical exam. She reports consuming a general diet.  Very active at home and light yard work.   She generally feels well. She reports sleeping fairly well. She does not have additional problems to discuss today.    Most recent fall risk assessment:    04/11/2023   12:53 PM  Fall Risk   Falls in the past year? 0  Number falls in past yr: 0  Injury with Fall? 0  Risk for fall due to : No Fall Risks  Follow up Falls evaluation completed     Most recent depression screenings:    04/11/2023   12:52 PM 04/05/2022    3:44 PM  PHQ 2/9 Scores  PHQ - 2 Score 0 3  PHQ- 9 Score 0 10    Vision:Within last year and Dental: No current dental problems and Receives regular dental care  Patient Active Problem List   Diagnosis Date Noted   Hearing decreased, right 04/11/2023   Former smoker 04/11/2023   Posterior left knee pain 11/01/2022   Radiculitis of left cervical region 12/01/2021   Attention deficit hyperactivity disorder (ADHD), predominantly inattentive type 06/16/2021   Chronic left shoulder pain 03/24/2021   Frequent headaches 08/25/2020   Seborrheic keratosis 08/25/2020   Vitamin D insufficiency 03/18/2020   Vaginal atrophy 11/26/2019   Irritability and anger 02/13/2019   No energy 01/05/2019   Memory changes 01/05/2019   Inattention 01/05/2019   Panic attacks 10/06/2018   Atypical squamous cell changes of undetermined significance (ASCUS) on cervical cytology with negative high risk human papilloma virus (HPV) test result 06/29/2018   Nabothian cyst 06/27/2018   Post-menopausal 06/27/2018   Rhinorrhea 01/24/2018   Lymphadenopathy 01/24/2018   Pap smear abnormality of cervix/human papillomavirus (HPV) positive 04/10/2017   Skin  tear of left hand without complication 03/28/2017   Preventive measure 03/28/2017   Left internal coxa saltans 07/19/2016   Drooping eyelid, right 05/03/2016   Peripheral vision loss, right 05/03/2016   GAD (generalized anxiety disorder) 04/19/2016   Vaping nicotine dependence, non-tobacco product 12/17/2014   Tinea pedis 01/07/2014   Congenital pes cavus 03/05/2013   Lumbar degenerative disc disease 11/01/2012   Primary osteoarthritis of both hands 10/02/2012   Past Medical History:  Diagnosis Date   ADHD (attention deficit hyperactivity disorder)    Anxiety    Arthritis    Past Surgical History:  Procedure Laterality Date   BREAST BIOPSY Left 2008   breast biospy  2008   benign   BREAST EXCISIONAL BIOPSY Left 2008   CHOLECYSTECTOMY     2010   laprascopy     SHOULDER ARTHROSCOPY Left 05/11/2022   Procedure: ARTHROSCOPY SHOULDER;  Surgeon: Bjorn Pippin, MD;  Location: [facility];  Service: Orthopedics;  Laterality: Left;   Family Status  Relation Name Status   Mother  Alive   Father  Alive  No partnership data on file   Family History  Problem Relation Age of Onset   Cancer Mother        breast   Diabetes Mother    Hypertension Mother    Breast cancer Mother 69   Diabetes Father    Allergies  Allergen Reactions  Hydrocodone Bit-Homatrop Mbr Other (See Comments)    Dizziness, almost passed out    Patient Care Team: Nolene Ebbs as PCP - General (Family Medicine)   Outpatient Medications Prior to Visit  Medication Sig   diclofenac Sodium (VOLTAREN) 1 % GEL Apply 4 g topically 4 (four) times daily. To affected joint.   [DISCONTINUED] amphetamine-dextroamphetamine (ADDERALL XR) 10 MG 24 hr capsule Take 1 capsule (10 mg total) by mouth daily.   [DISCONTINUED] desvenlafaxine (PRISTIQ) 50 MG 24 hr tablet Take 1 tablet (50 mg total) by mouth daily.   [DISCONTINUED] predniSONE (DELTASONE) 50 MG tablet Take 1 tablet (50 mg total) by mouth  daily with breakfast.   No facility-administered medications prior to visit.    ROS:  Noticed some right ear hearing loss over the years to certain tones and intermittent ringing in ears.         Objective:     BP 139/74   Pulse 65   Resp 20   Ht 5\' 3"  (1.6 m)   Wt 144 lb (65.3 kg)   LMP 07/05/2016   SpO2 99%   BMI 25.51 kg/m  BP Readings from Last 3 Encounters:  04/11/23 139/74  05/11/22 131/87  04/05/22 136/75   Wt Readings from Last 3 Encounters:  04/11/23 144 lb (65.3 kg)  05/11/22 133 lb 9.6 oz (60.6 kg)  04/05/22 131 lb (59.4 kg)      Physical Exam  BP 139/74   Pulse 65   Resp 20   Ht 5\' 3"  (1.6 m)   Wt 144 lb (65.3 kg)   LMP 07/05/2016   SpO2 99%   BMI 25.51 kg/m   General Appearance:    Alert, cooperative, no distress, appears stated age  Head:    Normocephalic, without obvious abnormality, atraumatic  Eyes:    PERRL, conjunctiva/corneas clear, EOM's intact, fundi    benign, both eyes  Ears:    tympanosclerosis both ears  Nose:   Nares normal, septum midline, mucosa normal, no drainage    or sinus tenderness  Throat:   Lips, mucosa, and tongue normal; teeth and gums normal  Neck:   Supple, symmetrical, trachea midline, no adenopathy;    thyroid:  no enlargement/tenderness/nodules; no carotid   bruit or JVD  Back:     Symmetric, no curvature, ROM normal, no CVA tenderness  Lungs:     Clear to auscultation bilaterally, respirations unlabored  Chest Wall:    No tenderness or deformity   Heart:    Regular rate and rhythm, S1 and S2 normal, no murmur, rub   or gallop     Abdomen:     Soft, non-tender, bowel sounds active all four quadrants,    no masses, no organomegaly        Extremities:   Extremities normal, atraumatic, no cyanosis or edema  Pulses:   2+ and symmetric all extremities  Skin:   Skin color, texture, turgor normal, no rashes or lesions  Lymph nodes:   Cervical, supraclavicular, and axillary nodes normal  Neurologic:   CNII-XII  intact, normal strength, sensation and reflexes    throughout      Assessment & Plan:    Routine Health Maintenance and Physical Exam  Immunization History  Administered Date(s) Administered   Influenza, Seasonal, Injecte, Preservative Fre 04/11/2023   Influenza,inj,Quad PF,6+ Mos 07/19/2018, 02/13/2019, 03/17/2020   PFIZER(Purple Top)SARS-COV-2 Vaccination 12/12/2019, 01/09/2020   Pfizer(Comirnaty)Fall Seasonal Vaccine 12 years and older 04/11/2023   Tdap 05/17/2010, 04/21/2011, 12/09/2020  Health Maintenance  Topic Date Due   HIV Screening  Never done   Colonoscopy  Never done   Cervical Cancer Screening (Pap smear)  06/27/2019   MAMMOGRAM  07/10/2021   Lung Cancer Screening  Never done   Zoster Vaccines- Shingrix (1 of 2) 07/12/2023 (Originally 12/29/1990)   COVID-19 Vaccine (4 - 2023-24 season) 06/06/2023   DTaP/Tdap/Td (4 - Td or Tdap) 12/10/2030   INFLUENZA VACCINE  Completed   Hepatitis C Screening  Completed   HPV VACCINES  Aged Out    Discussed health benefits of physical activity, and encouraged her to engage in regular exercise appropriate for her age and condition.  Marland KitchenKennyth Arnold was seen today for annual exam.  Diagnoses and all orders for this visit:  Physical exam, annual -     Vitamin D (25 hydroxy) -     CBC w/Diff/Platelet -     CMP14+EGFR -     Lipid panel -     TSH -     Ambulatory referral to Gastroenterology -     HIV antibody (with reflex) -     MM 3D SCREENING MAMMOGRAM BILATERAL BREAST  Vitamin D insufficiency -     Vitamin D (25 hydroxy)  Need for influenza vaccination -     Flu vaccine trivalent PF, 6mos and older(Flulaval,Afluria,Fluarix,Fluzone)  Need for COVID-19 vaccine -     Pfizer Comirnaty Covid -19 Vaccine 52yrs and older  Visit for screening mammogram -     MM 3D SCREENING MAMMOGRAM BILATERAL BREAST  Colon cancer screening -     Ambulatory referral to Gastroenterology  Screening for diabetes mellitus -      CMP14+EGFR  Screening for lipid disorders -     Lipid panel  Former smoker -     Ambulatory Referral Lung Cancer Screening [facility]  Vaping nicotine dependence, non-tobacco product -     Ambulatory Referral Lung Cancer Screening [facility]  GAD (generalized anxiety disorder) -     desvenlafaxine (PRISTIQ) 50 MG 24 hr tablet; Take 1 tablet (50 mg total) by mouth daily.  Irritability and anger -     desvenlafaxine (PRISTIQ) 50 MG 24 hr tablet; Take 1 tablet (50 mg total) by mouth daily.  Hearing decreased, right   .[name] Discussed 150 minutes of exercise a week.  Encouraged vitamin D 1000 units and Calcium 1300mg  or 4 servings of dairy a day.  PHQ no concerns-continue pristiq Vitals look good Fasting labs ordered Colonoscopy and mammogram ordered Needs pap smear at GYN, make appt due to hx of abnormal cells in 2020. Lung cancer screening CT ordered due to hx of smoking and current vaping Flu and covid vaccine done today Pt did not want any further hearing testing done today      Tandy Gaw, PA-C

## 2023-04-12 ENCOUNTER — Encounter: Payer: Self-pay | Admitting: Physician Assistant

## 2023-04-12 DIAGNOSIS — E559 Vitamin D deficiency, unspecified: Secondary | ICD-10-CM | POA: Insufficient documentation

## 2023-04-12 LAB — CBC WITH DIFFERENTIAL/PLATELET
Basophils Absolute: 0 10*3/uL (ref 0.0–0.2)
Basos: 0 %
EOS (ABSOLUTE): 0 10*3/uL (ref 0.0–0.4)
Eos: 0 %
Hematocrit: 44.6 % (ref 34.0–46.6)
Hemoglobin: 14.6 g/dL (ref 11.1–15.9)
Immature Grans (Abs): 0 10*3/uL (ref 0.0–0.1)
Immature Granulocytes: 0 %
Lymphocytes Absolute: 2.5 10*3/uL (ref 0.7–3.1)
Lymphs: 35 %
MCH: 28.3 pg (ref 26.6–33.0)
MCHC: 32.7 g/dL (ref 31.5–35.7)
MCV: 86 fL (ref 79–97)
Monocytes Absolute: 0.6 10*3/uL (ref 0.1–0.9)
Monocytes: 8 %
Neutrophils Absolute: 4 10*3/uL (ref 1.4–7.0)
Neutrophils: 57 %
Platelets: 284 10*3/uL (ref 150–450)
RBC: 5.16 x10E6/uL (ref 3.77–5.28)
RDW: 12.7 % (ref 11.7–15.4)
WBC: 7.1 10*3/uL (ref 3.4–10.8)

## 2023-04-12 LAB — LIPID PANEL
Chol/HDL Ratio: 2.5 {ratio} (ref 0.0–4.4)
Cholesterol, Total: 159 mg/dL (ref 100–199)
HDL: 63 mg/dL (ref >39)
LDL Chol Calc (NIH): 78 mg/dL (ref 0–99)
Triglycerides: 97 mg/dL (ref 0–149)
VLDL Cholesterol Cal: 18 mg/dL (ref 5–40)

## 2023-04-12 LAB — CMP14+EGFR
ALT: 21 [IU]/L (ref 0–32)
AST: 23 [IU]/L (ref 0–40)
Albumin: 4.4 g/dL (ref 3.8–4.9)
Alkaline Phosphatase: 84 [IU]/L (ref 44–121)
BUN/Creatinine Ratio: 12 (ref 9–23)
BUN: 10 mg/dL (ref 6–24)
Bilirubin Total: 0.4 mg/dL (ref 0.0–1.2)
CO2: 20 mmol/L (ref 20–29)
Calcium: 10 mg/dL (ref 8.7–10.2)
Chloride: 104 mmol/L (ref 96–106)
Creatinine, Ser: 0.85 mg/dL (ref 0.57–1.00)
Globulin, Total: 2.6 g/dL (ref 1.5–4.5)
Glucose: 95 mg/dL (ref 70–99)
Potassium: 4.6 mmol/L (ref 3.5–5.2)
Sodium: 140 mmol/L (ref 134–144)
Total Protein: 7 g/dL (ref 6.0–8.5)
eGFR: 83 mL/min/{1.73_m2} (ref >59)

## 2023-04-12 LAB — VITAMIN D 25 HYDROXY (VIT D DEFICIENCY, FRACTURES): Vit D, 25-Hydroxy: 13 ng/mL — ABNORMAL LOW (ref 30.0–100.0)

## 2023-04-12 LAB — TSH: TSH: 1.8 u[IU]/mL (ref 0.450–4.500)

## 2023-04-12 LAB — HIV ANTIBODY (ROUTINE TESTING W REFLEX): HIV Screen 4th Generation wRfx: NONREACTIVE

## 2023-04-12 NOTE — Progress Notes (Signed)
Debra Massey,   Thyroid looks GREAT.  Cholesterol looks fantastic! Kidney, liver, glucose look good!  Vitamin D is VERY low. You need to be on 2,000 units daily and take with dairy for better absorption.

## 2023-04-27 ENCOUNTER — Encounter: Payer: Self-pay | Admitting: Physician Assistant

## 2023-07-22 DIAGNOSIS — Z1211 Encounter for screening for malignant neoplasm of colon: Secondary | ICD-10-CM | POA: Diagnosis not present

## 2023-07-22 DIAGNOSIS — D123 Benign neoplasm of transverse colon: Secondary | ICD-10-CM | POA: Diagnosis not present

## 2023-07-22 LAB — HM COLONOSCOPY

## 2023-09-10 ENCOUNTER — Ambulatory Visit

## 2023-09-10 ENCOUNTER — Other Ambulatory Visit: Payer: Self-pay

## 2023-09-10 ENCOUNTER — Ambulatory Visit
Admission: EM | Admit: 2023-09-10 | Discharge: 2023-09-10 | Disposition: A | Discharge status: Home / Self Care | Attending: Family Medicine | Admitting: Family Medicine

## 2023-09-10 DIAGNOSIS — S6992XA Unspecified injury of left wrist, hand and finger(s), initial encounter: Secondary | ICD-10-CM

## 2023-09-10 DIAGNOSIS — M25532 Pain in left wrist: Secondary | ICD-10-CM

## 2023-09-10 NOTE — ED Provider Notes (Signed)
 [facility]    CSN: [id number] Arrival date & time: 09/10/23  0831      History   Chief Complaint Chief Complaint  Patient presents with   Wrist Pain    HPI Debra Massey is a 53 y.o. female.   HPI 53 year old female presents with left wrist pain secondary to injury that occurred earlier this morning.  PMH significant for arthritis, anxiety, and ADHD.  Past Medical History:  Diagnosis Date   ADHD (attention deficit hyperactivity disorder)    Anxiety    Arthritis     Patient Active Problem List   Diagnosis Date Noted   Vitamin D  deficiency 04/12/2023   Hearing decreased, right 04/11/2023   Former smoker 04/11/2023   Posterior left knee pain 11/01/2022   Radiculitis of left cervical region 12/01/2021   Attention deficit hyperactivity disorder (ADHD), predominantly inattentive type 06/16/2021   Chronic left shoulder pain 03/24/2021   Frequent headaches 08/25/2020   Seborrheic keratosis 08/25/2020   Vitamin D  insufficiency 03/18/2020   Vaginal atrophy 11/26/2019   Irritability and anger 02/13/2019   No energy 01/05/2019   Memory changes 01/05/2019   Inattention 01/05/2019   Panic attacks 10/06/2018   Atypical squamous cell changes of undetermined significance (ASCUS) on cervical cytology with negative high risk human papilloma virus (HPV) test result 06/29/2018   Nabothian cyst 06/27/2018   Post-menopausal 06/27/2018   Rhinorrhea 01/24/2018   Lymphadenopathy 01/24/2018   Pap smear abnormality of cervix/human papillomavirus (HPV) positive 04/10/2017   Skin tear of left hand without complication 03/28/2017   Preventive measure 03/28/2017   Left internal coxa saltans 07/19/2016   Drooping eyelid, right 05/03/2016   Peripheral vision loss, right 05/03/2016   GAD (generalized anxiety disorder) 04/19/2016   Vaping nicotine dependence, non-tobacco product 12/17/2014   Tinea pedis 01/07/2014   Congenital pes cavus 03/05/2013   Lumbar degenerative disc  disease 11/01/2012   Primary osteoarthritis of both hands 10/02/2012    Past Surgical History:  Procedure Laterality Date   BREAST BIOPSY Left 2008   breast biospy  2008   benign   BREAST EXCISIONAL BIOPSY Left 2008   CHOLECYSTECTOMY     2010   laprascopy     SHOULDER ARTHROSCOPY Left 05/11/2022   Procedure: ARTHROSCOPY SHOULDER;  Surgeon: Micheline Ahr, MD;  Location: [facility];  Service: Orthopedics;  Laterality: Left;    OB History     Gravida  2   Para  2   Term      Preterm      AB      Living         SAB      IAB      Ectopic      Multiple      Live Births  2            Home Medications    Prior to Admission medications   Not on File    Family History Family History  Problem Relation Age of Onset   Cancer Mother        breast   Diabetes Mother    Hypertension Mother    Breast cancer Mother 66   Diabetes Father     Social History Social History   Tobacco Use   Smoking status: Former    Current packs/day: 1.00    Average packs/day: 1 pack/day for 27.0 years (27.0 ttl pk-yrs)    Types: Cigarettes   Smokeless tobacco: Never   Tobacco comments:  pt is vaping  Vaping Use   Vaping status: Every Day   Substances: Nicotine  Substance Use Topics   Alcohol  use: No   Drug use: No     Allergies   Hydrocodone  bit-homatrop mbr   Review of Systems Review of Systems  Musculoskeletal:        Left wrist pain secondary to injury this morning  All other systems reviewed and are negative.    Physical Exam Triage Vital Signs ED Triage Vitals  Encounter Vitals Group     BP 09/10/23 0920 134/84     Systolic BP Percentile --      Diastolic BP Percentile --      Pulse Rate 09/10/23 0920 77     Resp 09/10/23 0920 16     Temp 09/10/23 0920 98.2 F (36.8 C)     Temp src --      SpO2 09/10/23 0920 98 %     Weight 09/10/23 0922 148 lb (67.1 kg)     Height 09/10/23 0922 5\' 4"  (1.626 m)     Head Circumference --       Peak Flow --      Pain Score 09/10/23 0923 2     Pain Loc --      Pain Education --      Exclude from Growth Chart --    No data found.  Updated Vital Signs BP 134/84   Pulse 77   Temp 98.2 F (36.8 C)   Resp 16   Ht 5\' 4"  (1.626 m)   Wt 148 lb (67.1 kg)   LMP 07/05/2016   SpO2 98%   BMI 25.40 kg/m    Physical Exam Vitals and nursing note reviewed.  Constitutional:      Appearance: She is normal weight.  HENT:     Head: Normocephalic and atraumatic.     Mouth/Throat:     Mouth: Mucous membranes are moist.     Pharynx: Oropharynx is clear.  Eyes:     Extraocular Movements: Extraocular movements intact.     Conjunctiva/sclera: Conjunctivae normal.     Pupils: Pupils are equal, round, and reactive to light.  Cardiovascular:     Pulses: Normal pulses.     Heart sounds: Normal heart sounds.  Pulmonary:     Effort: Pulmonary effort is normal.     Breath sounds: Normal breath sounds. No wheezing, rhonchi or rales.  Chest:     Chest wall: No tenderness.  Musculoskeletal:        General: Normal range of motion.  Skin:    General: Skin is warm and dry.  Neurological:     General: No focal deficit present.     Mental Status: She is alert and oriented to person, place, and time. Mental status is at baseline.  Psychiatric:        Mood and Affect: Mood normal.        Behavior: Behavior normal.      UC Treatments / Results  Labs (all labs ordered are listed, but only abnormal results are displayed) Labs Reviewed - No data to display  EKG   Radiology DG Wrist Complete Left Result Date: 09/10/2023 CLINICAL DATA:  pushed up out of chair heard pop, pain EXAM: LEFT WRIST - COMPLETE 3+ VIEW COMPARISON:  04/19/2016 FINDINGS: There is no evidence of fracture or dislocation. There is no evidence of arthropathy or other focal bone abnormality. Soft tissues are unremarkable. IMPRESSION: Negative. Electronically Signed   By: Baker Bon  Marlena Sima M.D.   On: 09/10/2023 09:38     Procedures Procedures (including critical care time)  Medications Ordered in UC Medications - No data to display  Initial Impression / Assessment and Plan / UC Course  I have reviewed the triage vital signs and the nursing notes.  Pertinent labs & imaging results that were available during my care of the patient were reviewed by me and considered in my medical decision making (see chart for details).     MDM: 1.  Acute pain of left wrist-left wrist x-ray results reveal above; 2.  Injury of left wrist, initial encounter-Ace wrap placed on left wrist prior to discharge today.  Advised patient of x-ray results with hardcopy and image provided.  Advised patient may take OTC Ibuprofen  600 to 800 mg daily, as needed for left wrist pain.  Advised patient to RICE affected area of left wrist for 30 minutes 3 times daily for the next 3 days.  Advised if symptoms worsen over the next 7 to 10 days please follow-up with your PCP or [facility] orthopedics for further evaluation.  Encouraged to increase daily water intake to 64 ounces per day while taking this medication.  Final Clinical Impressions(s) / UC Diagnoses   Final diagnoses:  Injury of left wrist, initial encounter  Acute pain of left wrist     Discharge Instructions      Advised patient of x-ray results with hardcopy and image provided.  Advised patient may take OTC Ibuprofen  600 to 800 mg daily, as needed for left wrist pain.  Advised patient to RICE affected area of left wrist for 30 minutes 3 times daily for the next 3 days.  Advised if symptoms worsen over the next 7 to 10 days please follow-up with your PCP or [facility] orthopedics for further evaluation.  Encouraged to increase daily water intake to 64 ounces per day while taking this medication.     ED Prescriptions   None    PDMP not reviewed this encounter.   Leonides Ramp, FNP 09/10/23 1005

## 2023-09-10 NOTE — Discharge Instructions (Addendum)
 Advised patient of x-ray results with hardcopy and image provided.  Advised patient may take OTC Ibuprofen  600 to 800 mg daily, as needed for left wrist pain.  Advised patient to RICE affected area of left wrist for 30 minutes 3 times daily for the next 3 days.  Advised if symptoms worsen over the next 7 to 10 days please follow-up with your PCP or [facility] orthopedics for further evaluation.  Encouraged to increase daily water intake to 64 ounces per day while taking this medication.

## 2023-09-10 NOTE — ED Triage Notes (Signed)
 Pushed self up out of chair this morning heard pop in left wrist, has pain. Able to move it but hurts moving wrist backwards. Has not had otc meds.

## 2024-01-17 ENCOUNTER — Encounter: Payer: Self-pay | Admitting: Sports Medicine

## 2024-03-27 ENCOUNTER — Encounter: Payer: Self-pay | Admitting: Physician Assistant

## 2024-03-27 ENCOUNTER — Encounter: Admitting: Physician Assistant

## 2024-03-27 ENCOUNTER — Ambulatory Visit (INDEPENDENT_AMBULATORY_CARE_PROVIDER_SITE_OTHER): Admitting: Physician Assistant

## 2024-03-27 VITALS — BP 130/70 | HR 70 | Ht 64.0 in | Wt 145.0 lb

## 2024-03-27 DIAGNOSIS — F411 Generalized anxiety disorder: Secondary | ICD-10-CM

## 2024-03-27 DIAGNOSIS — Z131 Encounter for screening for diabetes mellitus: Secondary | ICD-10-CM

## 2024-03-27 DIAGNOSIS — E559 Vitamin D deficiency, unspecified: Secondary | ICD-10-CM

## 2024-03-27 DIAGNOSIS — Z23 Encounter for immunization: Secondary | ICD-10-CM

## 2024-03-27 DIAGNOSIS — Z0000 Encounter for general adult medical examination without abnormal findings: Secondary | ICD-10-CM | POA: Diagnosis not present

## 2024-03-27 DIAGNOSIS — Z13220 Encounter for screening for lipoid disorders: Secondary | ICD-10-CM | POA: Diagnosis not present

## 2024-03-27 DIAGNOSIS — M5136 Other intervertebral disc degeneration, lumbar region with discogenic back pain only: Secondary | ICD-10-CM

## 2024-03-27 DIAGNOSIS — Z1231 Encounter for screening mammogram for malignant neoplasm of breast: Secondary | ICD-10-CM

## 2024-03-27 DIAGNOSIS — F17200 Nicotine dependence, unspecified, uncomplicated: Secondary | ICD-10-CM | POA: Insufficient documentation

## 2024-03-27 MED ORDER — CELECOXIB 200 MG PO CAPS: 200.0000 mg | ORAL_CAPSULE | Freq: Two times a day (BID) | ORAL | 3 refills | Status: AC

## 2024-03-27 NOTE — Progress Notes (Unsigned)
   Complete physical exam  Patient: Debra Massey   DOB: [birth year]   53 y.o. Female  MRN: [id number]  Subjective:    No chief complaint on file.   Debra Massey is a 53 y.o. female who presents today for a complete physical exam. She reports consuming a {diet types:17450} diet. {types:19826} She generally feels {DESC; WELL/FAIRLY WELL/POORLY:18703}. She reports sleeping {DESC; WELL/FAIRLY WELL/POORLY:18703}. She {does/does not:200015} have additional problems to discuss today.    Most recent fall risk assessment:    04/11/2023   12:53 PM  Fall Risk   Falls in the past year? 0  Number falls in past yr: 0  Injury with Fall? 0  Risk for fall due to : No Fall Risks  Follow up Falls evaluation completed     Most recent depression screenings:    04/11/2023   12:52 PM 04/05/2022    3:44 PM  PHQ 2/9 Scores  PHQ - 2 Score 0 3  PHQ- 9 Score 0  10      Data saved with a previous flowsheet row definition    {VISON DENTAL STD PSA (Optional):27386}  {History (Optional):23778}  Patient Care Team: Athan Casalino L, PA-C as PCP - General (Family Medicine)   No outpatient medications prior to visit.   No facility-administered medications prior to visit.    ROS        Objective:     LMP 07/05/2016  {Vitals History (Optional):23777}  Physical Exam   No results found for any visits on 03/27/24. {Show previous labs (optional):23779}    Assessment & Plan:    Routine Health Maintenance and Physical Exam  Immunization History  Administered Date(s) Administered   Influenza, Seasonal, Injecte, Preservative Fre 04/11/2023   Influenza,inj,Quad PF,6+ Mos 07/19/2018, 02/13/2019, 03/17/2020   PFIZER(Purple Top)SARS-COV-2 Vaccination 12/12/2019, 01/09/2020   Pfizer(Comirnaty)Fall Seasonal Vaccine 12 years and older 04/11/2023   Tdap 05/17/2010, 04/21/2011, 12/09/2020    Health Maintenance  Topic Date Due   Hepatitis B Vaccines 19-59 Average Risk (1 of 3 - 19+ 3-dose  series) Never done   Zoster Vaccines- Shingrix (1 of 2) Never done   Cervical Cancer Screening (Pap smear)  06/27/2019   Mammogram  07/10/2021   Lung Cancer Screening  Never done   Pneumococcal Vaccine: 50+ Years (1 of 1 - PCV) Never done   COVID-19 Vaccine (4 - 2025-26 season) 01/16/2024   Influenza Vaccine  08/14/2024 (Originally 12/16/2023)   DTaP/Tdap/Td (4 - Td or Tdap) 12/10/2030   Colonoscopy  07/21/2033   Hepatitis C Screening  Completed   HIV Screening  Completed   HPV VACCINES  Aged Out   Meningococcal B Vaccine  Aged Out    Discussed health benefits of physical activity, and encouraged her to engage in regular exercise appropriate for her age and condition.  Problem List Items Addressed This Visit   None  No follow-ups on file.     Raheen Capili, PA-C

## 2024-03-27 NOTE — Patient Instructions (Addendum)
 Please schedule mammogram  Health Maintenance, Female Adopting a healthy lifestyle and getting preventive care are important in promoting health and wellness. Ask your health care provider about: The right schedule for you to have regular tests and exams. Things you can do on your own to prevent diseases and keep yourself healthy. What should I know about diet, weight, and exercise? Eat a healthy diet  Eat a diet that includes plenty of vegetables, fruits, low-fat dairy products, and lean protein. Do not eat a lot of foods that are high in solid fats, added sugars, or sodium. Maintain a healthy weight Body mass index (BMI) is used to identify weight problems. It estimates body fat based on height and weight. Your health care provider can help determine your BMI and help you achieve or maintain a healthy weight. Get regular exercise Get regular exercise. This is one of the most important things you can do for your health. Most adults should: Exercise for at least 150 minutes each week. The exercise should increase your heart rate and make you sweat (moderate-intensity exercise). Do strengthening exercises at least twice a week. This is in addition to the moderate-intensity exercise. Spend less time sitting. Even light physical activity can be beneficial. Watch cholesterol and blood lipids Have your blood tested for lipids and cholesterol at 52 years of age, then have this test every 5 years. Have your cholesterol levels checked more often if: Your lipid or cholesterol levels are high. You are older than 52 years of age. You are at high risk for heart disease. What should I know about cancer screening? Depending on your health history and family history, you may need to have cancer screening at various ages. This may include screening for: Breast cancer. Cervical cancer. Colorectal cancer. Skin cancer. Lung cancer. What should I know about heart disease, diabetes, and high blood  pressure? Blood pressure and heart disease High blood pressure causes heart disease and increases the risk of stroke. This is more likely to develop in people who have high blood pressure readings or are overweight. Have your blood pressure checked: Every 3-5 years if you are 25-16 years of age. Every year if you are 59 years old or older. Diabetes Have regular diabetes screenings. This checks your fasting blood sugar level. Have the screening done: Once every three years after age 63 if you are at a normal weight and have a low risk for diabetes. More often and at a younger age if you are overweight or have a high risk for diabetes. What should I know about preventing infection? Hepatitis B If you have a higher risk for hepatitis B, you should be screened for this virus. Talk with your health care provider to find out if you are at risk for hepatitis B infection. Hepatitis C Testing is recommended for: Everyone born from [birth year] through 1964. Anyone with known risk factors for hepatitis C. Sexually transmitted infections (STIs) Get screened for STIs, including gonorrhea and chlamydia, if: You are sexually active and are younger than 52 years of age. You are older than 52 years of age and your health care provider tells you that you are at risk for this type of infection. Your sexual activity has changed since you were last screened, and you are at increased risk for chlamydia or gonorrhea. Ask your health care provider if you are at risk. Ask your health care provider about whether you are at high risk for HIV. Your health care provider may recommend a prescription medicine  to help prevent HIV infection. If you choose to take medicine to prevent HIV, you should first get tested for HIV. You should then be tested every 3 months for as long as you are taking the medicine. Pregnancy If you are about to stop having your period (premenopausal) and you may become pregnant, seek counseling before you  get pregnant. Take 400 to 800 micrograms (mcg) of folic acid  every day if you become pregnant. Ask for birth control (contraception) if you want to prevent pregnancy. Osteoporosis and menopause Osteoporosis is a disease in which the bones lose minerals and strength with aging. This can result in bone fractures. If you are 54 years old or older, or if you are at risk for osteoporosis and fractures, ask your health care provider if you should: Be screened for bone loss. Take a calcium or vitamin D  supplement to lower your risk of fractures. Be given hormone replacement therapy (HRT) to treat symptoms of menopause. Follow these instructions at home: Alcohol  use Do not drink alcohol  if: Your health care provider tells you not to drink. You are pregnant, may be pregnant, or are planning to become pregnant. If you drink alcohol : Limit how much you have to: 0-1 drink a day. Know how much alcohol  is in your drink. In the [place] one drink equals one 12 oz bottle of beer (355 mL), one 5 oz glass of wine (148 mL), or one 1 oz glass of hard liquor (44 mL). Lifestyle Do not use any products that contain nicotine or tobacco. These products include cigarettes, chewing tobacco, and vaping devices, such as e-cigarettes. If you need help quitting, ask your health care provider. Do not use street drugs. Do not share needles. Ask your health care provider for help if you need support or information about quitting drugs. General instructions Schedule regular health, dental, and eye exams. Stay current with your vaccines. Tell your health care provider if: You often feel depressed. You have ever been abused or do not feel safe at home. Summary Adopting a healthy lifestyle and getting preventive care are important in promoting health and wellness. Follow your health care provider's instructions about healthy diet, exercising, and getting tested or screened for diseases. Follow your health care provider's  instructions on monitoring your cholesterol and blood pressure. This information is not intended to replace advice given to you by your health care provider. Make sure you discuss any questions you have with your health care provider. Document Revised: 09/22/2020 Document Reviewed: 09/22/2020 Elsevier Patient Education  2024 [employer].

## 2024-03-28 ENCOUNTER — Ambulatory Visit: Payer: Self-pay | Admitting: Physician Assistant

## 2024-03-28 ENCOUNTER — Encounter: Payer: Self-pay | Admitting: Physician Assistant

## 2024-03-28 LAB — CMP14+EGFR
ALT: 25 IU/L (ref 0–32)
AST: 23 IU/L (ref 0–40)
Albumin: 4.5 g/dL (ref 3.8–4.9)
Alkaline Phosphatase: 71 IU/L (ref 49–135)
BUN/Creatinine Ratio: 9 (ref 9–23)
BUN: 8 mg/dL (ref 6–24)
Bilirubin Total: 0.3 mg/dL (ref 0.0–1.2)
CO2: 20 mmol/L (ref 20–29)
Calcium: 9.3 mg/dL (ref 8.7–10.2)
Chloride: 106 mmol/L (ref 96–106)
Creatinine, Ser: 0.86 mg/dL (ref 0.57–1.00)
Globulin, Total: 2.2 g/dL (ref 1.5–4.5)
Glucose: 89 mg/dL (ref 70–99)
Potassium: 4.3 mmol/L (ref 3.5–5.2)
Sodium: 142 mmol/L (ref 134–144)
Total Protein: 6.7 g/dL (ref 6.0–8.5)
eGFR: 81 mL/min/1.73 (ref >59)

## 2024-03-28 LAB — CBC WITH DIFFERENTIAL/PLATELET
Basophils Absolute: 0 x10E3/uL (ref 0.0–0.2)
Basos: 1 %
EOS (ABSOLUTE): 0.1 x10E3/uL (ref 0.0–0.4)
Eos: 1 %
Hematocrit: 43.8 % (ref 34.0–46.6)
Hemoglobin: 14.5 g/dL (ref 11.1–15.9)
Immature Grans (Abs): 0 x10E3/uL (ref 0.0–0.1)
Immature Granulocytes: 0 %
Lymphocytes Absolute: 3 x10E3/uL (ref 0.7–3.1)
Lymphs: 38 %
MCH: 28.6 pg (ref 26.6–33.0)
MCHC: 33.1 g/dL (ref 31.5–35.7)
MCV: 86 fL (ref 79–97)
Monocytes Absolute: 0.6 x10E3/uL (ref 0.1–0.9)
Monocytes: 7 %
Neutrophils Absolute: 4.2 x10E3/uL (ref 1.4–7.0)
Neutrophils: 53 %
Platelets: 299 x10E3/uL (ref 150–450)
RBC: 5.07 x10E6/uL (ref 3.77–5.28)
RDW: 12.7 % (ref 11.7–15.4)
WBC: 7.8 x10E3/uL (ref 3.4–10.8)

## 2024-03-28 LAB — LIPID PANEL
Chol/HDL Ratio: 3.1 ratio (ref 0.0–4.4)
Cholesterol, Total: 166 mg/dL (ref 100–199)
HDL: 54 mg/dL (ref >39)
LDL Chol Calc (NIH): 92 mg/dL (ref 0–99)
Triglycerides: 110 mg/dL (ref 0–149)
VLDL Cholesterol Cal: 20 mg/dL (ref 5–40)

## 2024-03-28 LAB — TSH: TSH: 2.05 u[IU]/mL (ref 0.450–4.500)

## 2024-03-28 LAB — VITAMIN D 25 HYDROXY (VIT D DEFICIENCY, FRACTURES): Vit D, 25-Hydroxy: 15.9 ng/mL — ABNORMAL LOW (ref 30.0–100.0)

## 2024-03-28 LAB — VITAMIN B12: Vitamin B-12: 642 pg/mL (ref 232–1245)

## 2024-03-28 NOTE — Progress Notes (Signed)
 Tarri,   B12 looks good.  Thyroid  looks good.  Cholesterol good.  Vitamin D  still low. How much are you taking daily?

## 2024-03-30 NOTE — Progress Notes (Signed)
 Increase to 2000 units a day daily with dairy for better absorption.

## 2024-06-21 ENCOUNTER — Ambulatory Visit

## 2024-07-27 ENCOUNTER — Ambulatory Visit
